# Patient Record
Sex: Male | Born: 1960 | Race: White | Hispanic: No | Marital: Married
Health system: Midwestern US, Academic
[De-identification: ages and names within clinical notes are randomized; demographics above are authoritative.]

---

## 2018-03-10 ENCOUNTER — Encounter: Admit: 2018-03-10 | Discharge: 2018-03-10 | Payer: BC Managed Care – PPO

## 2018-03-10 ENCOUNTER — Emergency Department: Admit: 2018-03-10 | Discharge: 2018-03-10 | Payer: BC Managed Care – PPO

## 2018-03-10 ENCOUNTER — Encounter: Admit: 2018-03-10 | Discharge: 2018-03-11 | Payer: BC Managed Care – PPO

## 2018-03-10 DIAGNOSIS — S32009D Unspecified fracture of unspecified lumbar vertebra, subsequent encounter for fracture with routine healing: ICD-10-CM

## 2018-03-10 DIAGNOSIS — J9690 Respiratory failure, unspecified, unspecified whether with hypoxia or hypercapnia: ICD-10-CM

## 2018-03-10 DIAGNOSIS — W19XXXD Unspecified fall, subsequent encounter: ICD-10-CM

## 2018-03-10 DIAGNOSIS — E43 Unspecified severe protein-calorie malnutrition: ICD-10-CM

## 2018-03-10 DIAGNOSIS — W19XXXA Unspecified fall, initial encounter: Secondary | ICD-10-CM

## 2018-03-10 DIAGNOSIS — E119 Type 2 diabetes mellitus without complications: Principal | ICD-10-CM

## 2018-03-10 DIAGNOSIS — S2220XD Unspecified fracture of sternum, subsequent encounter for fracture with routine healing: ICD-10-CM

## 2018-03-10 DIAGNOSIS — J939 Pneumothorax, unspecified: ICD-10-CM

## 2018-03-10 DIAGNOSIS — S2249XA Multiple fractures of ribs, unspecified side, initial encounter for closed fracture: ICD-10-CM

## 2018-03-10 DIAGNOSIS — T797XXD Traumatic subcutaneous emphysema, subsequent encounter: ICD-10-CM

## 2018-03-10 DIAGNOSIS — S82871A Displaced pilon fracture of right tibia, initial encounter for closed fracture: Principal | ICD-10-CM

## 2018-03-10 MED ORDER — FENTANYL CITRATE (PF) 50 MCG/ML IJ SOLN
100 ug | Freq: Once | INTRAVENOUS | 0 refills | Status: CP
Start: 2018-03-10 — End: ?
  Administered 2018-03-11: 04:00:00 100 ug via INTRAVENOUS

## 2018-03-10 MED ORDER — KETAMINE 10 MG/ML IJ SOLN
30 mg | Freq: Once | INTRAVENOUS | 0 refills | Status: CP
Start: 2018-03-10 — End: ?
  Administered 2018-03-11: 04:00:00 80 mg via INTRAVENOUS

## 2018-03-10 MED ORDER — FENTANYL PCA 550 MCG/55 ML SYR (STD CONC)(ADULT)(PREMADE)
INTRAVENOUS | 0 refills | Status: DC
Start: 2018-03-10 — End: 2018-03-11
  Administered 2018-03-11 (×2): 55.000 mL via INTRAVENOUS

## 2018-03-10 MED ORDER — LACTATED RINGERS IV SOLP
1000 mL | INTRAVENOUS | 0 refills | Status: CP
Start: 2018-03-10 — End: ?
  Administered 2018-03-11: 04:00:00 1000 mL via INTRAVENOUS

## 2018-03-10 MED ORDER — PROPOFOL INJ 10 MG/ML IV VIAL
100 mg | Freq: Once | INTRAVENOUS | 0 refills | Status: CP
Start: 2018-03-10 — End: ?

## 2018-03-10 MED ORDER — FENTANYL CITRATE (PF) 50 MCG/ML IJ SOLN
100 ug | INTRAVENOUS | 0 refills | Status: DC | PRN
Start: 2018-03-10 — End: 2018-03-11

## 2018-03-10 MED ORDER — LACTATED RINGERS IV SOLP
INTRAVENOUS | 0 refills | Status: DC
Start: 2018-03-10 — End: 2018-03-11
  Administered 2018-03-11: 04:00:00 1000.000 mL via INTRAVENOUS

## 2018-03-10 MED ORDER — NALOXONE 0.4 MG/ML IJ SOLN
.08 mg | INTRAVENOUS | 0 refills | Status: DC | PRN
Start: 2018-03-10 — End: 2018-03-18

## 2018-03-11 ENCOUNTER — Encounter: Admit: 2018-03-11 | Discharge: 2018-03-11 | Payer: BC Managed Care – PPO

## 2018-03-11 ENCOUNTER — Inpatient Hospital Stay: Admit: 2018-03-11 | Discharge: 2018-03-11 | Payer: BC Managed Care – PPO

## 2018-03-11 ENCOUNTER — Encounter: Admit: 2018-03-11 | Discharge: 2018-03-12 | Payer: BC Managed Care – PPO

## 2018-03-11 DIAGNOSIS — S32040D Wedge compression fracture of fourth lumbar vertebra, subsequent encounter for fracture with routine healing: ICD-10-CM

## 2018-03-11 DIAGNOSIS — S32020D Wedge compression fracture of second lumbar vertebra, subsequent encounter for fracture with routine healing: ICD-10-CM

## 2018-03-11 DIAGNOSIS — W19XXXD Unspecified fall, subsequent encounter: Principal | ICD-10-CM

## 2018-03-11 DIAGNOSIS — S82871A Displaced pilon fracture of right tibia, initial encounter for closed fracture: Principal | ICD-10-CM

## 2018-03-11 DIAGNOSIS — E119 Type 2 diabetes mellitus without complications: Principal | ICD-10-CM

## 2018-03-11 LAB — BASIC METABOLIC PANEL
Lab: 102 MMOL/L (ref 98–110)
Lab: 13 % — ABNORMAL HIGH (ref 3–12)
Lab: 137 MMOL/L (ref 137–147)
Lab: 139 mg/dL — ABNORMAL HIGH (ref 70–100)
Lab: 22 MMOL/L (ref 21–30)
Lab: 3.9 MMOL/L (ref 3.5–5.1)
Lab: >60 mL/min (ref >60)
Lab: >60 mL/min (ref >60)
Lab: 134 MMOL/L — ABNORMAL LOW (ref 137–147)
Lab: 137 MMOL/L (ref 137–147)

## 2018-03-11 LAB — IONIZED CALCIUM
Lab: 1 MMOL/L — ABNORMAL LOW (ref >60)
Lab: 1 MMOL/L — ABNORMAL LOW (ref 1.0–1.3)

## 2018-03-11 LAB — CBC
Lab: 28 10*3/uL — ABNORMAL HIGH (ref 4.5–11.0)
Lab: 15 K/UL — ABNORMAL HIGH (ref 4.5–11.0)
Lab: 13 % — ABNORMAL LOW (ref 11–15)
Lab: 15 K/UL — ABNORMAL HIGH (ref 4.5–11.0)
Lab: 260 K/UL (ref >60)
Lab: 31 pg (ref 26–34)
Lab: 33 g/dL (ref 32.0–36.0)
Lab: 7 FL (ref >60)

## 2018-03-11 LAB — CREATINE KINASE-CPK
Lab: 314 U/L — ABNORMAL HIGH (ref 35–232)
Lab: 480 U/L — ABNORMAL HIGH (ref 35–232)
Lab: 456 U/L — ABNORMAL HIGH (ref 35–232)

## 2018-03-11 LAB — PROTIME INR (PT): Lab: 1.1 M/UL (ref 0.8–1.2)

## 2018-03-11 LAB — MAGNESIUM
Lab: 1.9 mg/dL — ABNORMAL LOW (ref 1.6–2.6)
Lab: 2 mg/dL — ABNORMAL LOW (ref 1.6–2.6)

## 2018-03-11 LAB — PTT (APTT): Lab: 22 s — ABNORMAL LOW (ref 24.0–36.5)

## 2018-03-11 LAB — PHOSPHORUS
Lab: 3.3 mg/dL — ABNORMAL LOW (ref 2.0–4.5)
Lab: 4.3 mg/dL — ABNORMAL HIGH (ref 2.0–4.5)

## 2018-03-11 LAB — ALCOHOL LEVEL: Lab: <10 mg/dL (ref 8.5–10.6)

## 2018-03-11 LAB — LACTIC ACID (BG - RAPID LACTATE)
Lab: 3.1 MMOL/L — ABNORMAL HIGH (ref 0.5–2.0)
Lab: 2 MMOL/L (ref 0.5–2.0)

## 2018-03-11 LAB — LACTIC ACID(LACTATE): Lab: 1.9 MMOL/L — ABNORMAL LOW (ref 0.5–2.0)

## 2018-03-11 MED ORDER — PROPOFOL INJ 10 MG/ML IV VIAL
0 refills | Status: DC
Start: 2018-03-11 — End: 2018-03-11
  Administered 2018-03-11: 15:00:00 90 mg via INTRAVENOUS

## 2018-03-11 MED ORDER — SENNOSIDES 8.6 MG PO TAB
1 | Freq: Two times a day (BID) | ORAL | 0 refills | Status: DC
Start: 2018-03-11 — End: 2018-03-12
  Administered 2018-03-11 – 2018-03-12 (×2): 1 via ORAL

## 2018-03-11 MED ORDER — PHENYLEPHRINE IN 0.9% NACL(PF) 1 MG/10 ML (100 MCG/ML) IV SYRG
INTRAVENOUS | 0 refills | Status: DC
Start: 2018-03-11 — End: 2018-03-11
  Administered 2018-03-11: 16:00:00 200 ug via INTRAVENOUS

## 2018-03-11 MED ORDER — KETAMINE IV DRIP (LOW DOSE INFUSION)
20 mg/h | INTRAVENOUS | 0 refills | Status: DC
Start: 2018-03-11 — End: 2018-03-13
  Administered 2018-03-11 – 2018-03-13 (×10): 20 mg/h via INTRAVENOUS

## 2018-03-11 MED ORDER — CEFAZOLIN 1 GRAM IJ SOLR
0 refills | Status: DC
Start: 2018-03-11 — End: 2018-03-11
  Administered 2018-03-11: 15:00:00 2 g via INTRAVENOUS

## 2018-03-11 MED ORDER — ENOXAPARIN 40 MG/0.4 ML SC SYRG
40 mg | Freq: Every day | SUBCUTANEOUS | 0 refills | Status: DC
Start: 2018-03-11 — End: 2018-03-18
  Administered 2018-03-12 – 2018-03-18 (×6): 40 mg via SUBCUTANEOUS

## 2018-03-11 MED ORDER — DEXAMETHASONE SODIUM PHOSPHATE 4 MG/ML IJ SOLN
INTRAVENOUS | 0 refills | Status: DC
Start: 2018-03-11 — End: 2018-03-11
  Administered 2018-03-11: 15:00:00 4 mg via INTRAVENOUS

## 2018-03-11 MED ORDER — FENTANYL CITRATE (PF) 50 MCG/ML IJ SOLN
25 ug | INTRAVENOUS | 0 refills | Status: DC | PRN
Start: 2018-03-11 — End: 2018-03-11

## 2018-03-11 MED ORDER — CEFAZOLIN INJ 1GM IVP
2 g | INTRAVENOUS | 0 refills | Status: CP
Start: 2018-03-11 — End: ?
  Administered 2018-03-11 – 2018-03-12 (×3): 2 g via INTRAVENOUS

## 2018-03-11 MED ORDER — MEPERIDINE (PF) 25 MG/ML IJ SYRG
12.5 mg | INTRAVENOUS | 0 refills | Status: DC | PRN
Start: 2018-03-11 — End: 2018-03-11

## 2018-03-11 MED ORDER — SUGAMMADEX 100 MG/ML IV SOLN
INTRAVENOUS | 0 refills | Status: DC
Start: 2018-03-11 — End: 2018-03-11
  Administered 2018-03-11: 16:00:00 200 mg via INTRAVENOUS

## 2018-03-11 MED ORDER — ELECTROLYTE-A IV SOLP
0 refills | Status: DC
Start: 2018-03-11 — End: 2018-03-11
  Administered 2018-03-11 (×2): via INTRAVENOUS

## 2018-03-11 MED ORDER — ENOXAPARIN 30 MG/0.3 ML SC SYRG
30 mg | Freq: Two times a day (BID) | SUBCUTANEOUS | 0 refills | Status: DC
Start: 2018-03-11 — End: 2018-03-11

## 2018-03-11 MED ORDER — OXYCODONE 5 MG PO TAB
5-10 mg | ORAL | 0 refills | Status: DC | PRN
Start: 2018-03-11 — End: 2018-03-29
  Administered 2018-03-11: 19:00:00 5 mg via ORAL
  Administered 2018-03-12 – 2018-03-14 (×11): 10 mg via ORAL
  Administered 2018-03-17 – 2018-03-18 (×2): 5 mg via ORAL
  Administered 2018-03-18: 23:00:00 10 mg via ORAL
  Administered 2018-03-19 (×3): 5 mg via ORAL
  Administered 2018-03-19: 04:00:00 10 mg via ORAL
  Administered 2018-03-20: 16:00:00 5 mg via ORAL
  Administered 2018-03-20 – 2018-03-21 (×3): 10 mg via ORAL
  Administered 2018-03-21: 01:00:00 5 mg via ORAL
  Administered 2018-03-22 – 2018-03-27 (×12): 10 mg via ORAL
  Administered 2018-03-28: 04:00:00 5 mg via ORAL

## 2018-03-11 MED ORDER — ONDANSETRON HCL (PF) 4 MG/2 ML IJ SOLN
INTRAVENOUS | 0 refills | Status: DC
Start: 2018-03-11 — End: 2018-03-11
  Administered 2018-03-11: 16:00:00 4 mg via INTRAVENOUS

## 2018-03-11 MED ORDER — BUPIVACAINE 0.0625% 50ML PCA EPIDURAL SYRINGE
EPIDURAL | 0 refills | Status: DC
Start: 2018-03-11 — End: 2018-03-12
  Administered 2018-03-11 – 2018-03-12 (×8): 50.000 mL via EPIDURAL

## 2018-03-11 MED ORDER — HYDROMORPHONE (PF) 2 MG/ML IJ SYRG
.5-1 mg | INTRAVENOUS | 0 refills | Status: DC | PRN
Start: 2018-03-11 — End: 2018-03-12

## 2018-03-11 MED ORDER — KETAMINE IV DRIP (LOW DOSE INFUSION)
.1 mg/kg/h | INTRAVENOUS | 0 refills | Status: DC
Start: 2018-03-11 — End: 2018-03-11
  Administered 2018-03-11 (×2): 0.1 mg/kg/h via INTRAVENOUS

## 2018-03-11 MED ORDER — POLYETHYLENE GLYCOL 3350 17 GRAM PO PWPK
1 | Freq: Every day | ORAL | 0 refills | Status: DC
Start: 2018-03-11 — End: 2018-03-12
  Administered 2018-03-11: 19:00:00 17 g via ORAL

## 2018-03-11 MED ORDER — DEXTRAN 70-HYPROMELLOSE (PF) 0.1-0.3 % OP DPET
0 refills | Status: DC
Start: 2018-03-11 — End: 2018-03-11
  Administered 2018-03-11: 15:00:00 1 [drp] via OPHTHALMIC

## 2018-03-11 MED ORDER — NALOXONE 0.4 MG/ML IJ SOLN
.08 mg | INTRAVENOUS | 0 refills | Status: DC | PRN
Start: 2018-03-11 — End: 2018-03-11

## 2018-03-11 MED ORDER — ACETAMINOPHEN 325 MG PO TAB
650 mg | ORAL | 0 refills | Status: DC
Start: 2018-03-11 — End: 2018-03-12
  Administered 2018-03-11 – 2018-03-12 (×4): 650 mg via ORAL

## 2018-03-11 MED ORDER — LIDOCAINE (PF) 200 MG/10 ML (2 %) IJ SYRG
0 refills | Status: DC
Start: 2018-03-11 — End: 2018-03-11
  Administered 2018-03-11 (×2): 60 mg via INTRAVENOUS

## 2018-03-11 MED ORDER — LIDOCAINE-EPINEPHRINE (PF) 1.5 %-1:200,000 IJ SOLN (OR)
0 refills | Status: CP
Start: 2018-03-11 — End: ?

## 2018-03-11 MED ORDER — PROMETHAZINE 25 MG/ML IJ SOLN
6.25 mg | INTRAVENOUS | 0 refills | Status: DC | PRN
Start: 2018-03-11 — End: 2018-03-11

## 2018-03-11 MED ORDER — OXYCODONE 5 MG PO TAB
5-10 mg | Freq: Once | ORAL | 0 refills | Status: DC | PRN
Start: 2018-03-11 — End: 2018-03-11

## 2018-03-11 MED ORDER — FENTANYL CITRATE (PF) 50 MCG/ML IJ SOLN
0 refills | Status: DC
Start: 2018-03-11 — End: 2018-03-11
  Administered 2018-03-11 (×3): 50 ug via INTRAVENOUS
  Administered 2018-03-11: 15:00:00 100 ug via INTRAVENOUS

## 2018-03-11 MED ORDER — EPHEDRINE SULFATE 50 MG/5ML SYR (10 MG/ML) (AN)(OSM)
0 refills | Status: DC
Start: 2018-03-11 — End: 2018-03-11
  Administered 2018-03-11 (×2): 10 mg via INTRAVENOUS

## 2018-03-11 MED ORDER — ROCURONIUM 10 MG/ML IV SOLN
INTRAVENOUS | 0 refills | Status: DC
Start: 2018-03-11 — End: 2018-03-11
  Administered 2018-03-11: 15:00:00 50 mg via INTRAVENOUS

## 2018-03-11 MED ORDER — ONDANSETRON HCL (PF) 4 MG/2 ML IJ SOLN
4-8 mg | INTRAVENOUS | 0 refills | Status: DC | PRN
Start: 2018-03-11 — End: 2018-04-04
  Administered 2018-03-12 (×2): 8 mg via INTRAVENOUS
  Administered 2018-03-14 – 2018-04-01 (×8): 4 mg via INTRAVENOUS

## 2018-03-11 MED ORDER — ACETAMINOPHEN 1,000 MG/100 ML (10 MG/ML) IV SOLN
1000 mg | Freq: Once | INTRAVENOUS | 0 refills | Status: CP
Start: 2018-03-11 — End: ?
  Administered 2018-03-11: 07:00:00 1000 mg via INTRAVENOUS

## 2018-03-11 MED ORDER — PHENYLEPHRINE IV DRIP (STD CONC)
0 refills | Status: DC
Start: 2018-03-11 — End: 2018-03-11
  Administered 2018-03-11 (×2): 0.4 ug/kg/min via INTRAVENOUS

## 2018-03-11 MED ORDER — DEXAMETHASONE SODIUM PHOSPHATE 4 MG/ML IJ SOLN
4 mg | Freq: Once | INTRAVENOUS | 0 refills | Status: DC | PRN
Start: 2018-03-11 — End: 2018-03-11

## 2018-03-11 MED ORDER — KETAMINE 10 MG/ML IJ SOLN (INFUSION)(AM)(OR)
0 refills | Status: DC
Start: 2018-03-11 — End: 2018-03-11
  Administered 2018-03-11: 15:00:00 200 ug/kg/h via INTRAVENOUS

## 2018-03-11 MED ORDER — LACTATED RINGERS IV SOLP
1000 mL | INTRAVENOUS | 0 refills | Status: DC
Start: 2018-03-11 — End: 2018-03-11

## 2018-03-11 MED ADMIN — FENTANYL CITRATE (PF) 50 MCG/ML IJ SOLN [3037]: 100 ug | INTRAVENOUS | @ 04:00:00 | Stop: 2018-03-11 | NDC 00409909412

## 2018-03-11 MED ADMIN — PROPOFOL 10 MG/ML IV EMUL [11150]: 50 mg | INTRAVENOUS | @ 04:00:00 | Stop: 2018-03-11 | NDC 63323026965

## 2018-03-11 MED ADMIN — ONDANSETRON HCL (PF) 4 MG/2 ML IJ SOLN [136012]: 4 mg | INTRAVENOUS | Stop: 2018-03-11 | NDC 00641607801

## 2018-03-12 ENCOUNTER — Inpatient Hospital Stay: Admit: 2018-03-12 | Discharge: 2018-03-12 | Payer: BC Managed Care – PPO

## 2018-03-12 LAB — BASIC METABOLIC PANEL
Lab: 0.9 mg/dL (ref 0.4–1.24)
Lab: 120 mg/dL — ABNORMAL HIGH (ref 70–100)
Lab: 13 mg/dL (ref 7–25)
Lab: 134 MMOL/L — ABNORMAL LOW (ref 137–147)
Lab: 8.4 mg/dL — ABNORMAL LOW (ref 8.5–10.6)
Lab: >60 mL/min (ref >60)

## 2018-03-12 LAB — CBC: Lab: 9.9 10*3/uL (ref 4.5–11.0)

## 2018-03-12 LAB — CREATINE KINASE-CPK
Lab: 369 U/L — ABNORMAL HIGH (ref 35–232)
Lab: 349 U/L — ABNORMAL HIGH (ref 35–232)

## 2018-03-12 LAB — MAGNESIUM: Lab: 2.3 mg/dL (ref 1.6–2.6)

## 2018-03-12 LAB — IONIZED CALCIUM: Lab: 1.1 MMOL/L — ABNORMAL LOW (ref 1.0–1.3)

## 2018-03-12 LAB — PHOSPHORUS: Lab: 2.8 mg/dL (ref 2.0–4.5)

## 2018-03-12 MED ORDER — METHOCARBAMOL 750 MG PO TAB
750 mg | Freq: Three times a day (TID) | ORAL | 0 refills | Status: DC
Start: 2018-03-12 — End: 2018-03-19
  Administered 2018-03-12 – 2018-03-19 (×22): 750 mg via ORAL

## 2018-03-12 MED ORDER — GABAPENTIN 300 MG PO CAP
600 mg | ORAL | 0 refills | Status: DC
Start: 2018-03-12 — End: 2018-03-29
  Administered 2018-03-12 – 2018-03-29 (×46): 600 mg via ORAL

## 2018-03-12 MED ORDER — FAMOTIDINE 20 MG PO TAB
20 mg | Freq: Two times a day (BID) | ORAL | 0 refills | Status: DC
Start: 2018-03-12 — End: 2018-03-18
  Administered 2018-03-13 – 2018-03-18 (×11): 20 mg via ORAL

## 2018-03-12 MED ORDER — POTASSIUM PHOSPHATE, MONOBASIC 500 MG PO TBSO
2 | Freq: Once | ORAL | 0 refills | Status: CP
Start: 2018-03-12 — End: ?
  Administered 2018-03-12: 13:00:00 2 via ORAL

## 2018-03-12 MED ORDER — FENTANYL/BUPIVACAINE 2 MCG/ML-0.1% PCA EPIDURAL SYRINGE
EPIDURAL | 0 refills | Status: DC
Start: 2018-03-12 — End: 2018-03-17
  Administered 2018-03-12 – 2018-03-17 (×31): 50.000 mL via EPIDURAL

## 2018-03-12 MED ORDER — POLYETHYLENE GLYCOL 3350 17 GRAM PO PWPK
2 | Freq: Every day | ORAL | 0 refills | Status: DC
Start: 2018-03-12 — End: 2018-03-15
  Administered 2018-03-12 – 2018-03-13 (×2): 34 g via ORAL

## 2018-03-12 MED ORDER — ACETAMINOPHEN 325 MG PO TAB
650 mg | ORAL | 0 refills | Status: DC
Start: 2018-03-12 — End: 2018-03-26
  Administered 2018-03-12 – 2018-03-26 (×44): 650 mg via ORAL

## 2018-03-12 MED ORDER — MAGNESIUM HYDROXIDE 2,400 MG/10 ML PO SUSP
10 mL | Freq: Once | ORAL | 0 refills | Status: CP
Start: 2018-03-12 — End: ?
  Administered 2018-03-12: 20:00:00 10 mL via ORAL

## 2018-03-12 MED ORDER — SENNOSIDES 8.6 MG PO TAB
2 | Freq: Two times a day (BID) | ORAL | 0 refills | Status: DC
Start: 2018-03-12 — End: 2018-03-15
  Administered 2018-03-12 – 2018-03-15 (×5): 2 via ORAL

## 2018-03-12 MED ORDER — SERTRALINE 25 MG PO TAB
12.5 mg | Freq: Every evening | ORAL | 0 refills | Status: DC
Start: 2018-03-12 — End: 2018-04-04
  Administered 2018-03-13 – 2018-04-04 (×22): 12.5 mg via ORAL

## 2018-03-12 MED ORDER — TRAZODONE 50 MG PO TAB
50 mg | Freq: Every evening | ORAL | 0 refills | Status: DC | PRN
Start: 2018-03-12 — End: 2018-03-15

## 2018-03-12 MED ORDER — CALCIUM CARBONATE 200 MG CALCIUM (500 MG) PO CHEW
500 mg | ORAL | 0 refills | Status: DC | PRN
Start: 2018-03-12 — End: 2018-03-26
  Administered 2018-03-14: 04:00:00 500 mg via ORAL

## 2018-03-12 MED ORDER — MELATONIN 5 MG PO TAB
10 mg | Freq: Every evening | ORAL | 0 refills | Status: DC
Start: 2018-03-12 — End: 2018-04-04
  Administered 2018-03-13 – 2018-04-04 (×21): 10 mg via ORAL

## 2018-03-13 ENCOUNTER — Encounter: Admit: 2018-03-13 | Discharge: 2018-03-13 | Payer: BC Managed Care – PPO

## 2018-03-13 DIAGNOSIS — E119 Type 2 diabetes mellitus without complications: Principal | ICD-10-CM

## 2018-03-13 DIAGNOSIS — S82871A Displaced pilon fracture of right tibia, initial encounter for closed fracture: Principal | ICD-10-CM

## 2018-03-13 LAB — CBC: Lab: 10 K/UL — ABNORMAL LOW (ref >60)

## 2018-03-13 LAB — MAGNESIUM: Lab: 2.2 mg/dL (ref 1.6–2.6)

## 2018-03-13 LAB — PHOSPHORUS: Lab: 2.8 mg/dL — ABNORMAL HIGH (ref >60)

## 2018-03-13 LAB — BASIC METABOLIC PANEL: Lab: 135 MMOL/L — ABNORMAL LOW (ref >60)

## 2018-03-14 ENCOUNTER — Inpatient Hospital Stay: Admit: 2018-03-18 | Discharge: 2018-03-18 | Payer: BC Managed Care – PPO

## 2018-03-14 DIAGNOSIS — S82871A Displaced pilon fracture of right tibia, initial encounter for closed fracture: Principal | ICD-10-CM

## 2018-03-14 LAB — CBC: Lab: 12 K/UL — ABNORMAL HIGH (ref >60)

## 2018-03-14 LAB — MAGNESIUM: Lab: 2.1 mg/dL — ABNORMAL LOW (ref 1.6–2.6)

## 2018-03-14 LAB — BASIC METABOLIC PANEL: Lab: 133 MMOL/L — ABNORMAL LOW (ref 137–147)

## 2018-03-14 LAB — PHOSPHORUS: Lab: 3.3 mg/dL — ABNORMAL HIGH (ref 2.0–4.5)

## 2018-03-14 MED ORDER — LACTATED RINGERS IV SOLP
500 mL | INTRAVENOUS | 0 refills | Status: CP
Start: 2018-03-14 — End: ?
  Administered 2018-03-15: 05:00:00 500 mL via INTRAVENOUS

## 2018-03-15 ENCOUNTER — Inpatient Hospital Stay: Admit: 2018-03-14 | Discharge: 2018-03-14 | Payer: BC Managed Care – PPO

## 2018-03-15 DIAGNOSIS — S82871A Displaced pilon fracture of right tibia, initial encounter for closed fracture: Principal | ICD-10-CM

## 2018-03-15 LAB — CBC: Lab: 11 K/UL — ABNORMAL HIGH (ref >60)

## 2018-03-15 LAB — C DIFFICILE BY PCR: Lab: NEGATIVE

## 2018-03-15 LAB — PHOSPHORUS: Lab: 3.7 mg/dL — ABNORMAL LOW (ref 2.0–4.5)

## 2018-03-15 LAB — MAGNESIUM: Lab: 2 mg/dL — ABNORMAL LOW (ref 1.6–2.6)

## 2018-03-15 LAB — BASIC METABOLIC PANEL: Lab: 133 MMOL/L — ABNORMAL LOW (ref >60)

## 2018-03-15 MED ORDER — NALOXEGOL 25 MG PO TAB
25 mg | Freq: Once | ORAL | 0 refills | Status: CP
Start: 2018-03-15 — End: ?
  Administered 2018-03-15: 21:00:00 25 mg via ORAL

## 2018-03-15 MED ORDER — TRAZODONE 50 MG PO TAB
50 mg | Freq: Every evening | ORAL | 0 refills | Status: DC
Start: 2018-03-15 — End: 2018-04-04
  Administered 2018-03-16 – 2018-04-04 (×18): 50 mg via ORAL

## 2018-03-15 MED ORDER — POTASSIUM CHLORIDE 20 MEQ PO TBTQ
20 meq | Freq: Once | ORAL | 0 refills | Status: CP
Start: 2018-03-15 — End: ?
  Administered 2018-03-15: 13:00:00 20 meq via ORAL

## 2018-03-16 ENCOUNTER — Inpatient Hospital Stay: Admit: 2018-03-16 | Discharge: 2018-03-16 | Payer: BC Managed Care – PPO

## 2018-03-16 DIAGNOSIS — S82871A Displaced pilon fracture of right tibia, initial encounter for closed fracture: Principal | ICD-10-CM

## 2018-03-16 MED ORDER — MILK/MOLASSES(#) 1:1 RECTAL ENEMA
1 | Freq: Once | RECTAL | 0 refills | Status: CP
Start: 2018-03-16 — End: ?
  Administered 2018-03-16: 21:00:00 200 mL via RECTAL

## 2018-03-16 MED ORDER — METHYLNALTREXONE 12 MG/0.6 ML SC SOLN
12 mg | Freq: Once | SUBCUTANEOUS | 0 refills | Status: CP
Start: 2018-03-16 — End: ?
  Administered 2018-03-16: 19:00:00 12 mg via SUBCUTANEOUS

## 2018-03-16 MED ORDER — NALOXEGOL 25 MG PO TAB
25 mg | Freq: Once | ORAL | 0 refills | Status: DC
Start: 2018-03-16 — End: 2018-03-16

## 2018-03-17 ENCOUNTER — Inpatient Hospital Stay: Admit: 2018-03-17 | Discharge: 2018-03-17 | Payer: BC Managed Care – PPO

## 2018-03-17 MED ORDER — BISACODYL 10 MG RE SUPP
10 mg | Freq: Once | RECTAL | 0 refills | Status: CP
Start: 2018-03-17 — End: ?
  Administered 2018-03-17: 20:00:00 10 mg via RECTAL

## 2018-03-17 MED ORDER — NALOXONE 0.4 MG/ML IJ SOLN
.08 mg | INTRAVENOUS | 0 refills | Status: DC | PRN
Start: 2018-03-17 — End: 2018-03-18

## 2018-03-17 MED ORDER — SIMETHICONE 80 MG PO CHEW
80 mg | Freq: Once | ORAL | 0 refills | Status: CP
Start: 2018-03-17 — End: ?
  Administered 2018-03-18: 03:00:00 80 mg via ORAL

## 2018-03-17 MED ORDER — SENNOSIDES-DOCUSATE SODIUM 8.6-50 MG PO TAB
1 | Freq: Two times a day (BID) | ORAL | 0 refills | Status: DC
Start: 2018-03-17 — End: 2018-03-22
  Administered 2018-03-17 – 2018-03-22 (×5): 1 via ORAL

## 2018-03-17 MED ORDER — BUPIVACAINE 0.125% PCA EPIDURAL SYR
EPIDURAL | 0 refills | Status: DC
Start: 2018-03-17 — End: 2018-03-18
  Administered 2018-03-17 – 2018-03-18 (×6): 50.000 mL via EPIDURAL

## 2018-03-17 MED ORDER — POLYETHYLENE GLYCOL 3350 17 GRAM PO PWPK
1 | Freq: Every day | ORAL | 0 refills | Status: DC
Start: 2018-03-17 — End: 2018-03-22
  Administered 2018-03-17 – 2018-03-21 (×4): 17 g via ORAL

## 2018-03-18 ENCOUNTER — Encounter: Admit: 2018-03-18 | Discharge: 2018-03-18 | Payer: BC Managed Care – PPO

## 2018-03-18 DIAGNOSIS — S82871A Displaced pilon fracture of right tibia, initial encounter for closed fracture: Principal | ICD-10-CM

## 2018-03-18 LAB — BASIC METABOLIC PANEL
Lab: 0.6 mg/dL — ABNORMAL HIGH (ref 0.4–1.24)
Lab: 118 mg/dL — ABNORMAL HIGH (ref 70–100)
Lab: 12 mg/dL (ref 7–25)
Lab: 138 MMOL/L — ABNORMAL LOW (ref 137–147)
Lab: 30 MMOL/L (ref 21–30)
Lab: 8.1 mg/dL — ABNORMAL LOW (ref 8.5–10.6)
Lab: 9 pg (ref 3–12)
Lab: >60 mL/min (ref >60)
Lab: >60 mL/min (ref >60)

## 2018-03-18 LAB — MAGNESIUM: Lab: 2.2 mg/dL — CL (ref 1.6–2.6)

## 2018-03-18 LAB — PHOSPHORUS: Lab: 3.6 mg/dL — ABNORMAL LOW (ref 2.0–4.5)

## 2018-03-18 LAB — TROPONIN-I: Lab: 0 ng/mL (ref 0.0–0.05)

## 2018-03-18 LAB — CBC: Lab: 11 K/UL — ABNORMAL HIGH (ref 4.5–11.0)

## 2018-03-18 MED ORDER — POTASSIUM CHLORIDE IN WATER 10 MEQ/50 ML IV PGBK
10 meq | INTRAVENOUS | 0 refills | Status: CP
Start: 2018-03-18 — End: ?
  Administered 2018-03-19 (×6): 10 meq via INTRAVENOUS

## 2018-03-18 MED ORDER — PANTOPRAZOLE 40 MG PO TBEC
40 mg | Freq: Every day | ORAL | 0 refills | Status: DC
Start: 2018-03-18 — End: 2018-03-26
  Administered 2018-03-19 – 2018-03-26 (×8): 40 mg via ORAL

## 2018-03-18 MED ORDER — DIPHENHYDRAMINE HCL 50 MG/ML IJ SOLN
25 mg | Freq: Once | INTRAVENOUS | 0 refills | Status: CP
Start: 2018-03-18 — End: ?
  Administered 2018-03-18: 20:00:00 25 mg via INTRAVENOUS

## 2018-03-18 MED ORDER — POTASSIUM CHLORIDE IN WATER 10 MEQ/50 ML IV PGBK
10 meq | INTRAVENOUS | 0 refills | Status: CP
Start: 2018-03-18 — End: ?
  Administered 2018-03-18 – 2018-03-19 (×6): 10 meq via INTRAVENOUS

## 2018-03-18 MED ORDER — POTASSIUM CHLORIDE 20 MEQ PO TBTQ
40 meq | Freq: Once | ORAL | 0 refills | Status: CP
Start: 2018-03-18 — End: ?
  Administered 2018-03-19: 05:00:00 40 meq via ORAL

## 2018-03-18 MED ORDER — PANTOPRAZOLE(#) 2MG/ML PO SUSP
40 mg | Freq: Every day | ORAL | 0 refills | Status: DC
Start: 2018-03-18 — End: 2018-03-18

## 2018-03-18 MED ORDER — ENOXAPARIN 30 MG/0.3 ML SC SYRG
30 mg | Freq: Two times a day (BID) | SUBCUTANEOUS | 0 refills | Status: DC
Start: 2018-03-18 — End: 2018-03-29
  Administered 2018-03-19 – 2018-03-29 (×20): 30 mg via SUBCUTANEOUS

## 2018-03-18 MED ORDER — IOHEXOL 350 MG IODINE/ML IV SOLN
100 mL | Freq: Once | INTRAVENOUS | 0 refills | Status: CP
Start: 2018-03-18 — End: ?
  Administered 2018-03-18: 20:00:00 100 mL via INTRAVENOUS

## 2018-03-18 MED ORDER — SODIUM CHLORIDE 0.9 % IJ SOLN
50 mL | Freq: Once | INTRAVENOUS | 0 refills | Status: CP
Start: 2018-03-18 — End: ?
  Administered 2018-03-18: 20:00:00 50 mL via INTRAVENOUS

## 2018-03-18 MED ORDER — DEXTRAN 70-HYPROMELLOSE 0.1-0.3 % OP DROP
1 [drp] | OPHTHALMIC | 0 refills | Status: DC | PRN
Start: 2018-03-18 — End: 2018-04-04

## 2018-03-18 MED ADMIN — SODIUM CHLORIDE 0.9 % IV SOLP [27838]: 1000 mL | INTRAVENOUS | @ 18:00:00 | Stop: 2018-03-18 | NDC 00338004904

## 2018-03-19 ENCOUNTER — Inpatient Hospital Stay: Admit: 2018-03-19 | Discharge: 2018-03-19 | Payer: BC Managed Care – PPO

## 2018-03-19 ENCOUNTER — Encounter: Admit: 2018-03-19 | Discharge: 2018-03-19 | Payer: BC Managed Care – PPO

## 2018-03-19 DIAGNOSIS — S82871A Displaced pilon fracture of right tibia, initial encounter for closed fracture: Principal | ICD-10-CM

## 2018-03-19 LAB — BASIC METABOLIC PANEL
Lab: 0.5 mg/dL (ref 0.4–1.24)
Lab: 10 % — ABNORMAL HIGH (ref <=5)
Lab: 102 MMOL/L (ref 98–110)
Lab: 11 mg/dL — AB (ref <=5)
Lab: 110 mg/dL — ABNORMAL HIGH (ref <=2)
Lab: 141 MMOL/L — ABNORMAL LOW (ref 137–147)
Lab: 2.7 MMOL/L — CL (ref 3.5–5.1)
Lab: 29 MMOL/L (ref 21–30)
Lab: 8.3 mg/dL — ABNORMAL LOW (ref 8.5–10.6)
Lab: >60 mL/min — AB (ref >60)
Lab: 0.5 mg/dL (ref 0.4–1.24)
Lab: 10 mg/dL (ref 7–25)
Lab: 103 MMOL/L (ref 98–110)
Lab: 122 mg/dL — ABNORMAL HIGH (ref 70–100)
Lab: 140 MMOL/L (ref 137–147)
Lab: 26 MMOL/L (ref 21–30)
Lab: 3 MMOL/L — ABNORMAL LOW (ref 3.5–5.1)
Lab: 8.1 mg/dL — ABNORMAL LOW (ref 8.5–10.6)
Lab: >60 mL/min (ref >60)
Lab: >60 mL/min (ref >60)

## 2018-03-19 MED ORDER — CYCLOBENZAPRINE 10 MG PO TAB
10 mg | Freq: Three times a day (TID) | ORAL | 0 refills | Status: DC
Start: 2018-03-19 — End: 2018-03-26
  Administered 2018-03-19 – 2018-03-26 (×21): 10 mg via ORAL

## 2018-03-19 MED ORDER — FENTANYL CITRATE (PF) 50 MCG/ML IJ SOLN
100 ug | Freq: Once | INTRAVENOUS | 0 refills | Status: CP
Start: 2018-03-19 — End: ?

## 2018-03-19 MED ORDER — POTASSIUM CHLORIDE IN WATER 10 MEQ/50 ML IV PGBK
10 meq | INTRAVENOUS | 0 refills | Status: CP
Start: 2018-03-19 — End: ?
  Administered 2018-03-19 (×4): 10 meq via INTRAVENOUS

## 2018-03-19 MED ORDER — POTASSIUM CHLORIDE 20 MEQ PO TBTQ
40 meq | ORAL | 0 refills | Status: CP
Start: 2018-03-19 — End: ?
  Administered 2018-03-20 (×2): 40 meq via ORAL

## 2018-03-19 MED ORDER — MAGNESIUM SULFATE IN WATER 4 GRAM/50 ML (8 %) IV PGBK
4 g | Freq: Once | INTRAVENOUS | 0 refills | Status: CP
Start: 2018-03-19 — End: ?
  Administered 2018-03-19: 16:00:00 4 g via INTRAVENOUS

## 2018-03-19 MED ORDER — POTASSIUM CHLORIDE 20 MEQ PO TBTQ
80 meq | Freq: Once | ORAL | 0 refills | Status: CP
Start: 2018-03-19 — End: ?
  Administered 2018-03-19: 16:00:00 80 meq via ORAL

## 2018-03-19 MED ADMIN — SODIUM CHLORIDE 0.9 % IV SOLP [27838]: 1000 mL | INTRAVENOUS | @ 16:00:00 | Stop: 2018-03-19 | NDC 00338004904

## 2018-03-19 MED ADMIN — FENTANYL CITRATE (PF) 50 MCG/ML IJ SOLN [3037]: 100 ug | INTRAVENOUS | @ 22:00:00 | Stop: 2018-03-19 | NDC 00409909412

## 2018-03-20 ENCOUNTER — Emergency Department: Admit: 2018-03-10 | Discharge: 2018-03-10 | Payer: BC Managed Care – PPO

## 2018-03-20 ENCOUNTER — Inpatient Hospital Stay: Admit: 2018-03-20 | Discharge: 2018-03-20 | Payer: BC Managed Care – PPO

## 2018-03-20 ENCOUNTER — Inpatient Hospital Stay: Admit: 2018-03-20 | Discharge: 2018-03-21 | Payer: BC Managed Care – PPO

## 2018-03-20 DIAGNOSIS — S82871A Displaced pilon fracture of right tibia, initial encounter for closed fracture: Principal | ICD-10-CM

## 2018-03-20 LAB — BASIC METABOLIC PANEL
Lab: 0.5 mg/dL — ABNORMAL LOW (ref 0.4–1.24)
Lab: 102 MMOL/L (ref 98–110)
Lab: 131 mg/dL — ABNORMAL HIGH (ref 70–100)
Lab: 138 MMOL/L (ref 137–147)
Lab: 27 MMOL/L (ref 21–30)
Lab: 3.2 MMOL/L — ABNORMAL LOW (ref 3.5–5.1)
Lab: 8.1 mg/dL — ABNORMAL LOW (ref 8.5–10.6)
Lab: 9 K/UL — ABNORMAL HIGH (ref 3–12)
Lab: 9 mg/dL (ref 7–25)
Lab: >60 mL/min (ref >60)
Lab: 0.5 mg/dL (ref 0.4–1.24)
Lab: 104 MMOL/L (ref 98–110)
Lab: 138 MMOL/L — ABNORMAL LOW (ref 137–147)
Lab: 28 MMOL/L — ABNORMAL HIGH (ref 21–30)
Lab: 6 (ref 3–12)
Lab: 8 mg/dL (ref 7–25)
Lab: 98 mg/dL (ref 70–100)

## 2018-03-20 MED ORDER — SIMETHICONE 80 MG PO CHEW
80 mg | Freq: Two times a day (BID) | ORAL | 0 refills | Status: DC
Start: 2018-03-20 — End: 2018-03-26
  Administered 2018-03-20 – 2018-03-26 (×13): 80 mg via ORAL

## 2018-03-20 MED ORDER — POTASSIUM CHLORIDE 20 MEQ PO TBTQ
60 meq | Freq: Once | ORAL | 0 refills | Status: CP
Start: 2018-03-20 — End: ?
  Administered 2018-03-20: 17:00:00 60 meq via ORAL

## 2018-03-20 MED ORDER — NALOXEGOL 25 MG PO TAB
25 mg | Freq: Every day | ORAL | 0 refills | Status: CP
Start: 2018-03-20 — End: ?
  Administered 2018-03-20 – 2018-03-22 (×3): 25 mg via ORAL

## 2018-03-21 DIAGNOSIS — S82871A Displaced pilon fracture of right tibia, initial encounter for closed fracture: Principal | ICD-10-CM

## 2018-03-21 LAB — MAGNESIUM: Lab: 2.1 mg/dL — ABNORMAL LOW (ref 1.6–2.6)

## 2018-03-21 LAB — CBC: Lab: 10 10*3/uL (ref 4.5–11.0)

## 2018-03-21 LAB — BASIC METABOLIC PANEL
Lab: 0.6 mg/dL — ABNORMAL HIGH (ref 0.4–1.24)
Lab: 12 pg (ref 3–12)
Lab: 137 MMOL/L — ABNORMAL LOW (ref 137–147)
Lab: 146 mg/dL — ABNORMAL HIGH (ref 70–100)
Lab: 26 MMOL/L (ref 21–30)

## 2018-03-21 LAB — PHOSPHORUS: Lab: 3.6 mg/dL — ABNORMAL LOW (ref 2.0–4.5)

## 2018-03-21 MED ORDER — POTASSIUM CHLORIDE 20 MEQ PO TBTQ
40 meq | Freq: Once | ORAL | 0 refills | Status: CP
Start: 2018-03-21 — End: ?
  Administered 2018-03-21: 21:00:00 40 meq via ORAL

## 2018-03-21 MED ORDER — LR WITH POTASSIUM CHLORIDE 40 MEQ/L IV INFUSION
INTRAVENOUS | 0 refills | Status: AC
Start: 2018-03-21 — End: ?
  Administered 2018-03-21 – 2018-03-22 (×4): 1000.000 mL via INTRAVENOUS

## 2018-03-22 ENCOUNTER — Inpatient Hospital Stay: Admit: 2018-03-22 | Discharge: 2018-03-22 | Payer: BC Managed Care – PPO

## 2018-03-22 LAB — BASIC METABOLIC PANEL
Lab: 0.5 mg/dL (ref 0.4–1.24)
Lab: 10 mg/dL (ref 7–25)
Lab: 105 mg/dL — ABNORMAL HIGH (ref 70–100)
Lab: 135 MMOL/L — ABNORMAL LOW (ref 137–147)
Lab: 22 MMOL/L (ref 21–30)
Lab: 4 MMOL/L (ref 3.5–5.1)
Lab: 8.4 mg/dL — ABNORMAL LOW (ref 8.5–10.6)
Lab: >60 mL/min (ref >60)
Lab: >60 mL/min (ref >60)
Lab: 135 MMOL/L — ABNORMAL LOW (ref 137–147)

## 2018-03-22 LAB — PHOSPHORUS: Lab: 3.9 mg/dL — ABNORMAL HIGH (ref 2.0–4.5)

## 2018-03-22 LAB — MAGNESIUM: Lab: 2 mg/dL — ABNORMAL HIGH (ref 1.6–2.6)

## 2018-03-22 LAB — CBC: Lab: 11 10*3/uL — ABNORMAL HIGH (ref 4.5–11.0)

## 2018-03-22 LAB — TROPONIN-I: Lab: 0 ng/mL (ref 0.0–0.05)

## 2018-03-22 MED ORDER — MILK/MOLASSES(#) 1:1 RECTAL ENEMA
1 | RECTAL | 0 refills | Status: DC
Start: 2018-03-22 — End: 2018-03-25
  Administered 2018-03-22 – 2018-03-25 (×9): 200 mL via RECTAL

## 2018-03-22 MED ORDER — POTASSIUM CHLORIDE 20 MEQ PO TBTQ
40 meq | Freq: Once | ORAL | 0 refills | Status: CP
Start: 2018-03-22 — End: ?
  Administered 2018-03-22: 15:00:00 40 meq via ORAL

## 2018-03-22 MED ORDER — POLYETHYLENE GLYCOL 3350 17 GRAM PO PWPK
2 | Freq: Every day | ORAL | 0 refills | Status: DC
Start: 2018-03-22 — End: 2018-03-31
  Administered 2018-03-23 – 2018-03-27 (×5): 34 g via ORAL

## 2018-03-22 MED ORDER — SENNOSIDES-DOCUSATE SODIUM 8.6-50 MG PO TAB
2 | Freq: Two times a day (BID) | ORAL | 0 refills | Status: DC
Start: 2018-03-22 — End: 2018-03-29
  Administered 2018-03-23 – 2018-03-29 (×12): 2 via ORAL

## 2018-03-22 MED ORDER — MAGNESIUM HYDROXIDE 2,400 MG/10 ML PO SUSP
20 mL | Freq: Every evening | ORAL | 0 refills | Status: DC
Start: 2018-03-22 — End: 2018-03-31
  Administered 2018-03-23 – 2018-03-29 (×7): 20 mL via ORAL

## 2018-03-23 LAB — BASIC METABOLIC PANEL: Lab: 136 MMOL/L — ABNORMAL LOW (ref >60)

## 2018-03-23 LAB — CBC: Lab: 8.4 K/UL — ABNORMAL HIGH (ref 4.5–11.0)

## 2018-03-23 LAB — PHOSPHORUS: Lab: 4.4 mg/dL — ABNORMAL LOW (ref 2.0–4.5)

## 2018-03-23 LAB — MAGNESIUM: Lab: 2.2 mg/dL — ABNORMAL LOW (ref >60)

## 2018-03-23 MED ORDER — POTASSIUM CHLORIDE 20 MEQ PO TBTQ
40 meq | Freq: Once | ORAL | 0 refills | Status: CP
Start: 2018-03-23 — End: ?
  Administered 2018-03-23: 20:00:00 40 meq via ORAL

## 2018-03-24 ENCOUNTER — Inpatient Hospital Stay: Admit: 2018-03-24 | Discharge: 2018-03-24 | Payer: BC Managed Care – PPO

## 2018-03-24 DIAGNOSIS — S82871A Displaced pilon fracture of right tibia, initial encounter for closed fracture: Principal | ICD-10-CM

## 2018-03-25 LAB — BASIC METABOLIC PANEL
Lab: 0.7 mg/dL (ref 0.4–1.24)
Lab: 10 (ref 3–12)
Lab: 104 mg/dL — ABNORMAL HIGH (ref 70–100)
Lab: 135 MMOL/L — ABNORMAL LOW (ref 137–147)
Lab: 2.8 MMOL/L — ABNORMAL LOW (ref 3.5–5.1)
Lab: 26 MMOL/L (ref 21–30)
Lab: 8 mg/dL (ref 7–25)
Lab: 8.4 mg/dL — ABNORMAL LOW (ref 8.5–10.6)
Lab: 99 MMOL/L (ref 98–110)
Lab: >60 mL/min (ref >60)
Lab: >60 mL/min (ref >60)

## 2018-03-25 MED ORDER — POTASSIUM CHLORIDE 20 MEQ PO TBTQ
20 meq | Freq: Once | ORAL | 0 refills | Status: CP
Start: 2018-03-25 — End: ?
  Administered 2018-03-25: 20:00:00 20 meq via ORAL

## 2018-03-25 MED ORDER — POTASSIUM CHLORIDE IN WATER 10 MEQ/50 ML IV PGBK
10 meq | INTRAVENOUS | 0 refills | Status: CP
Start: 2018-03-25 — End: ?
  Administered 2018-03-25 – 2018-03-26 (×5): 10 meq via INTRAVENOUS

## 2018-03-25 MED ORDER — BISACODYL 10 MG RE SUPP
10 mg | Freq: Two times a day (BID) | RECTAL | 0 refills | Status: DC
Start: 2018-03-25 — End: 2018-04-04
  Administered 2018-03-25 – 2018-04-03 (×8): 10 mg via RECTAL

## 2018-03-25 MED ORDER — POTASSIUM CHLORIDE 20 MEQ PO TBTQ
40 meq | Freq: Once | ORAL | 0 refills | Status: CP
Start: 2018-03-25 — End: ?
  Administered 2018-03-26: 04:00:00 40 meq via ORAL

## 2018-03-25 MED ORDER — MILK/MOLASSES(#) 1:1 RECTAL ENEMA
1 | Freq: Two times a day (BID) | RECTAL | 0 refills | Status: DC
Start: 2018-03-25 — End: 2018-03-26
  Administered 2018-03-25: 22:00:00 200 mL via RECTAL

## 2018-03-25 MED ORDER — POTASSIUM CHLORIDE IN WATER 10 MEQ/50 ML IV PGBK
10 meq | INTRAVENOUS | 0 refills | Status: CP
Start: 2018-03-25 — End: ?
  Administered 2018-03-26 (×4): 10 meq via INTRAVENOUS

## 2018-03-25 MED ORDER — PATCH DOCUMENTATION - LIDOCAINE 5%
Freq: Two times a day (BID) | TRANSDERMAL | 0 refills | Status: DC
Start: 2018-03-25 — End: 2018-03-29

## 2018-03-25 MED ORDER — LIDOCAINE 5 % TP PTMD
1 | Freq: Every day | TOPICAL | 0 refills | Status: DC
Start: 2018-03-25 — End: 2018-03-29
  Administered 2018-03-25 – 2018-03-29 (×5): 1 via TOPICAL

## 2018-03-25 MED ADMIN — SODIUM CHLORIDE 0.9 % IV SOLP [27838]: 1000 mL | INTRAVENOUS | @ 20:00:00 | Stop: 2018-03-25 | NDC 00338004904

## 2018-03-26 ENCOUNTER — Inpatient Hospital Stay: Admit: 2018-03-26 | Discharge: 2018-03-26 | Payer: BC Managed Care – PPO

## 2018-03-26 DIAGNOSIS — S82871A Displaced pilon fracture of right tibia, initial encounter for closed fracture: Principal | ICD-10-CM

## 2018-03-26 LAB — MAGNESIUM
Lab: 2.4 mg/dL — ABNORMAL LOW (ref 1.6–2.6)
Lab: 2.4 mg/dL — ABNORMAL LOW (ref 1.6–2.6)

## 2018-03-26 LAB — BASIC METABOLIC PANEL
Lab: 0.6 mg/dL — AB (ref 0.4–1.24)
Lab: 10 — AB (ref 3–12)
Lab: 101 MMOL/L (ref 98–110)
Lab: 118 mg/dL — ABNORMAL HIGH (ref 70–100)
Lab: 135 MMOL/L — ABNORMAL LOW (ref 137–147)
Lab: 24 MMOL/L (ref 21–30)
Lab: 7 mg/dL — AB (ref 7–25)
Lab: 8.4 mg/dL — ABNORMAL LOW (ref 8.5–10.6)
Lab: >60 mL/min (ref >60)
Lab: >60 mL/min (ref >60)
Lab: 139 MMOL/L — ABNORMAL LOW (ref 137–147)

## 2018-03-26 LAB — POTASSIUM: Lab: 3.5 MMOL/L — ABNORMAL LOW (ref 3.5–5.1)

## 2018-03-26 LAB — PHOSPHORUS: Lab: 3.6 mg/dL (ref 2.0–4.5)

## 2018-03-26 MED ORDER — POTASSIUM CHLORIDE 20 MEQ PO TBTQ
40 meq | Freq: Once | ORAL | 0 refills | Status: CP
Start: 2018-03-26 — End: ?
  Administered 2018-03-26: 13:00:00 40 meq via ORAL

## 2018-03-26 MED ORDER — MILK/MOLASSES(#) 1:1 RECTAL ENEMA
1 | Freq: Two times a day (BID) | RECTAL | 0 refills | Status: DC | PRN
Start: 2018-03-26 — End: 2018-03-31

## 2018-03-26 MED ORDER — ATORVASTATIN 20 MG PO TAB
20 mg | Freq: Every evening | ORAL | 0 refills | Status: DC
Start: 2018-03-26 — End: 2018-03-26

## 2018-03-26 MED ORDER — POTASSIUM CHLORIDE IN WATER 10 MEQ/50 ML IV PGBK
10 meq | INTRAVENOUS | 0 refills | Status: CP
Start: 2018-03-26 — End: ?
  Administered 2018-03-26 (×3): 10 meq via INTRAVENOUS

## 2018-03-26 MED ORDER — PANCRELIPASE-SODIUM BICARBONATE 20,880 K UNIT-650 MG
NASOGASTRIC | 0 refills | Status: DC | PRN
Start: 2018-03-26 — End: 2018-03-27
  Administered 2018-03-27 (×4): via NASOGASTRIC

## 2018-03-26 MED ORDER — PHENOL 1.4 % MM SPRA
2 | OROMUCOSAL | 0 refills | Status: DC | PRN
Start: 2018-03-26 — End: 2018-04-04
  Administered 2018-03-26: 23:00:00 2 via OROMUCOSAL

## 2018-03-26 MED ORDER — POTASSIUM CHLORIDE IN WATER 10 MEQ/50 ML IV PGBK
10 meq | INTRAVENOUS | 0 refills | Status: CP
Start: 2018-03-26 — End: ?
  Administered 2018-03-26 (×4): 10 meq via INTRAVENOUS

## 2018-03-26 MED ORDER — LISINOPRIL 10 MG PO TAB
10 mg | Freq: Every morning | ORAL | 0 refills | Status: DC
Start: 2018-03-26 — End: 2018-04-04
  Administered 2018-03-26 – 2018-04-04 (×9): 10 mg via ORAL

## 2018-03-26 MED ORDER — POTASSIUM CHLORIDE 20 MEQ PO TBTQ
40 meq | Freq: Once | ORAL | 0 refills | Status: CP
Start: 2018-03-26 — End: ?
  Administered 2018-03-27: 03:00:00 40 meq via ORAL

## 2018-03-26 MED ORDER — ACETAMINOPHEN 325 MG PO TAB
650 mg | ORAL | 0 refills | Status: DC | PRN
Start: 2018-03-26 — End: 2018-03-28
  Administered 2018-03-27: 23:00:00 650 mg via ORAL

## 2018-03-26 MED ADMIN — SODIUM CHLORIDE 0.9 % IV SOLP [27838]: 1000 mL | INTRAVENOUS | @ 08:00:00 | Stop: 2018-03-26 | NDC 00338004904

## 2018-03-27 DIAGNOSIS — S82871A Displaced pilon fracture of right tibia, initial encounter for closed fracture: Principal | ICD-10-CM

## 2018-03-27 LAB — MAGNESIUM: Lab: 2.4 mg/dL — ABNORMAL LOW (ref >60)

## 2018-03-27 LAB — POTASSIUM
Lab: 3.6 MMOL/L (ref 3.5–5.1)
Lab: 3.4 MMOL/L — ABNORMAL LOW (ref 3.5–5.1)

## 2018-03-27 LAB — BASIC METABOLIC PANEL: Lab: 142 MMOL/L — ABNORMAL LOW (ref 137–147)

## 2018-03-27 MED ORDER — POTASSIUM CHLORIDE IN WATER 10 MEQ/50 ML IV PGBK
10 meq | INTRAVENOUS | 0 refills | Status: CP
Start: 2018-03-27 — End: ?
  Administered 2018-03-27 (×4): 10 meq via INTRAVENOUS

## 2018-03-27 MED ORDER — FENTANYL CITRATE (PF) 50 MCG/ML IJ SOLN
50 ug | Freq: Once | INTRAVENOUS | 0 refills | Status: CP
Start: 2018-03-27 — End: ?
  Administered 2018-03-27: 10:00:00 50 ug via INTRAVENOUS

## 2018-03-27 MED ORDER — POTASSIUM CHLORIDE 20 MEQ PO TBTQ
40 meq | Freq: Once | ORAL | 0 refills | Status: CP
Start: 2018-03-27 — End: ?
  Administered 2018-03-27: 15:00:00 40 meq via ORAL

## 2018-03-27 MED ORDER — POTASSIUM CHLORIDE IN WATER 10 MEQ/50 ML IV PGBK
10 meq | INTRAVENOUS | 0 refills | Status: CP
Start: 2018-03-27 — End: ?
  Administered 2018-03-28 (×4): 10 meq via INTRAVENOUS

## 2018-03-28 ENCOUNTER — Inpatient Hospital Stay: Admit: 2018-03-28 | Discharge: 2018-03-28 | Payer: BC Managed Care – PPO

## 2018-03-28 ENCOUNTER — Encounter: Admit: 2018-03-28 | Discharge: 2018-03-28 | Payer: BC Managed Care – PPO

## 2018-03-28 ENCOUNTER — Encounter: Admit: 2018-03-28 | Discharge: 2018-03-29 | Payer: BC Managed Care – PPO

## 2018-03-28 DIAGNOSIS — S82871A Displaced pilon fracture of right tibia, initial encounter for closed fracture: Principal | ICD-10-CM

## 2018-03-28 LAB — CBC
Lab: 13 % (ref 11–15)
Lab: 29 pg (ref 26–34)
Lab: 32 g/dL (ref 32.0–36.0)
Lab: 33 % — ABNORMAL LOW (ref 40–50)
Lab: 5.9 10*3/uL (ref 4.5–11.0)
Lab: 90 FL (ref 80–100)

## 2018-03-28 LAB — BASIC METABOLIC PANEL: Lab: 143 MMOL/L — ABNORMAL LOW (ref >60)

## 2018-03-28 LAB — MAGNESIUM
Lab: 2.3 mg/dL — ABNORMAL LOW (ref 1.6–2.6)
Lab: 2.2 mg/dL — ABNORMAL LOW (ref >60)

## 2018-03-28 LAB — PHOSPHORUS: Lab: 4 mg/dL — ABNORMAL LOW (ref 2.0–4.5)

## 2018-03-28 MED ORDER — POTASSIUM CHLORIDE 20 MEQ PO TBTQ
40 meq | Freq: Once | ORAL | 0 refills | Status: AC
Start: 2018-03-28 — End: ?

## 2018-03-28 MED ORDER — GABAPENTIN 300 MG PO CAP
600 mg | ORAL | 0 refills | Status: DC
Start: 2018-03-28 — End: 2018-03-28

## 2018-03-28 MED ORDER — ROCURONIUM 10 MG/ML IV SOLN
INTRAVENOUS | 0 refills | Status: DC
Start: 2018-03-28 — End: 2018-03-28
  Administered 2018-03-28: 21:00:00 20 mg via INTRAVENOUS
  Administered 2018-03-28 (×2): 10 mg via INTRAVENOUS
  Administered 2018-03-28: 20:00:00 30 mg via INTRAVENOUS

## 2018-03-28 MED ORDER — PHENYLEPHRINE IN 0.9% NACL(PF) 1 MG/10 ML (100 MCG/ML) IV SYRG
INTRAVENOUS | 0 refills | Status: DC
Start: 2018-03-28 — End: 2018-03-28
  Administered 2018-03-28 (×2): 200 ug via INTRAVENOUS
  Administered 2018-03-28: 21:00:00 100 ug via INTRAVENOUS

## 2018-03-28 MED ORDER — SUGAMMADEX 100 MG/ML IV SOLN
INTRAVENOUS | 0 refills | Status: DC
Start: 2018-03-28 — End: 2018-03-28
  Administered 2018-03-28: 23:00:00 220 mg via INTRAVENOUS

## 2018-03-28 MED ORDER — EPHEDRINE SULFATE 50 MG/5ML SYR (10 MG/ML) (AN)(OSM)
0 refills | Status: DC
Start: 2018-03-28 — End: 2018-03-28
  Administered 2018-03-28 (×2): 10 mg via INTRAVENOUS

## 2018-03-28 MED ORDER — ROPIVACAINE (PF) 5 MG/ML (0.5 %) IJ SOLN
0 refills | Status: CP
Start: 2018-03-28 — End: ?
  Administered 2018-03-28: 20:00:00 10 mL

## 2018-03-28 MED ORDER — LIDOCAINE (PF) 10 MG/ML (1 %) IJ SOLN
SUBCUTANEOUS | 0 refills | Status: CP
Start: 2018-03-28 — End: ?
  Administered 2018-03-28: 20:00:00 3 mL via SUBCUTANEOUS

## 2018-03-28 MED ORDER — ACETAMINOPHEN 500 MG PO TAB
1000 mg | ORAL | 0 refills | Status: DC
Start: 2018-03-28 — End: 2018-03-29
  Administered 2018-03-29 (×2): 1000 mg via ORAL

## 2018-03-28 MED ORDER — HYDROMORPHONE (PF) 2 MG/ML IJ SYRG
.5-1 mg | INTRAVENOUS | 0 refills | Status: DC | PRN
Start: 2018-03-28 — End: 2018-03-29
  Administered 2018-03-29: 01:00:00 0.5 mg via INTRAVENOUS

## 2018-03-28 MED ORDER — DEXTRAN 70-HYPROMELLOSE (PF) 0.1-0.3 % OP DPET
0 refills | Status: DC
Start: 2018-03-28 — End: 2018-03-28
  Administered 2018-03-28: 20:00:00 2 [drp] via OPHTHALMIC

## 2018-03-28 MED ORDER — FENTANYL CITRATE (PF) 50 MCG/ML IJ SOLN
25 ug | INTRAVENOUS | 0 refills | Status: DC | PRN
Start: 2018-03-28 — End: 2018-03-29
  Administered 2018-03-29 (×3): 25 ug via INTRAVENOUS

## 2018-03-28 MED ORDER — FENTANYL CITRATE (PF) 50 MCG/ML IJ SOLN
0 refills | Status: DC
Start: 2018-03-28 — End: 2018-03-28
  Administered 2018-03-28: 23:00:00 50 ug via INTRAVENOUS

## 2018-03-28 MED ORDER — ONDANSETRON HCL (PF) 4 MG/2 ML IJ SOLN
INTRAVENOUS | 0 refills | Status: DC
Start: 2018-03-28 — End: 2018-03-28
  Administered 2018-03-28: 23:00:00 4 mg via INTRAVENOUS

## 2018-03-28 MED ORDER — ACETAMINOPHEN 1,000 MG/100 ML (10 MG/ML) IV SOLN
0 refills | Status: DC
Start: 2018-03-28 — End: 2018-03-28
  Administered 2018-03-28: 23:00:00 1000 mg via INTRAVENOUS

## 2018-03-28 MED ORDER — DEXAMETHASONE SODIUM PHOSPHATE 4 MG/ML IJ SOLN
4 mg | Freq: Once | INTRAVENOUS | 0 refills | Status: DC | PRN
Start: 2018-03-28 — End: 2018-03-29

## 2018-03-28 MED ORDER — CEFAZOLIN INJ 1GM IVP
2 g | INTRAVENOUS | 0 refills | Status: CP
Start: 2018-03-28 — End: ?
  Administered 2018-03-29 (×2): 2 g via INTRAVENOUS

## 2018-03-28 MED ORDER — MIDAZOLAM 1 MG/ML IJ SOLN
0 refills | Status: DC
Start: 2018-03-28 — End: 2018-03-28
  Administered 2018-03-28: 20:00:00 2 mg via INTRAVENOUS

## 2018-03-28 MED ORDER — DEXAMETHASONE SODIUM PHOSPHATE 4 MG/ML IJ SOLN
INTRAVENOUS | 0 refills | Status: DC
Start: 2018-03-28 — End: 2018-03-28
  Administered 2018-03-28: 21:00:00 4 mg via INTRAVENOUS

## 2018-03-28 MED ORDER — FENTANYL CITRATE (PF) 50 MCG/ML IJ SOLN
25 ug | INTRAVENOUS | 0 refills | Status: DC | PRN
Start: 2018-03-28 — End: 2018-03-29

## 2018-03-28 MED ORDER — LIDOCAINE (PF) 200 MG/10 ML (2 %) IJ SYRG
0 refills | Status: DC
Start: 2018-03-28 — End: 2018-03-28
  Administered 2018-03-28: 20:00:00 100 mg via INTRAVENOUS

## 2018-03-28 MED ORDER — POTASSIUM CHLORIDE IN WATER 10 MEQ/50 ML IV PGBK
0 refills | Status: DC
Start: 2018-03-28 — End: 2018-03-28
  Administered 2018-03-28 (×3): 10 meq via INTRAVENOUS

## 2018-03-28 MED ORDER — OXYCODONE 5 MG PO TAB
5-10 mg | ORAL | 0 refills | Status: DC | PRN
Start: 2018-03-28 — End: 2018-03-28

## 2018-03-28 MED ORDER — PROPOFOL INJ 10 MG/ML IV VIAL
0 refills | Status: DC
Start: 2018-03-28 — End: 2018-03-28
  Administered 2018-03-28: 20:00:00 150 mg via INTRAVENOUS
  Administered 2018-03-28: 20:00:00 50 mg via INTRAVENOUS

## 2018-03-28 MED ORDER — ROPIVACAINE (PF) 5 MG/ML (0.5 %) IJ SOLN
0 refills | Status: CP
Start: 2018-03-28 — End: ?

## 2018-03-28 MED ORDER — METHYLNALTREXONE 12 MG/0.6 ML SC SOLN
12 mg | Freq: Once | SUBCUTANEOUS | 0 refills | Status: CP
Start: 2018-03-28 — End: ?
  Administered 2018-03-28: 17:00:00 12 mg via SUBCUTANEOUS

## 2018-03-28 MED ORDER — LACTATED RINGERS IV SOLP
1000 mL | Freq: Once | INTRAVENOUS | 0 refills | Status: DC
Start: 2018-03-28 — End: 2018-03-29

## 2018-03-28 MED ORDER — BUPIVACAINE 0.5 % (5 MG/ML) IJ SOLN
0 refills | Status: DC
Start: 2018-03-28 — End: 2018-03-28
  Administered 2018-03-28: 23:00:00 30 mL via INTRAMUSCULAR

## 2018-03-28 MED ORDER — OXYCODONE 5 MG PO TAB
5-10 mg | Freq: Once | ORAL | 0 refills | Status: DC | PRN
Start: 2018-03-28 — End: 2018-03-29

## 2018-03-28 MED ORDER — CEFAZOLIN 1 GRAM IJ SOLR
0 refills | Status: DC
Start: 2018-03-28 — End: 2018-03-28
  Administered 2018-03-28: 21:00:00 2 g via INTRAVENOUS

## 2018-03-28 MED ORDER — LACTATED RINGERS IV SOLP
0 refills | Status: DC
Start: 2018-03-28 — End: 2018-03-28
  Administered 2018-03-28 (×2): via INTRAVENOUS

## 2018-03-28 MED ORDER — BUPIVACAINE 0.125% PCA PNC SYR
PERINEURAL | 0 refills | Status: DC
Start: 2018-03-28 — End: 2018-04-03
  Administered 2018-03-29 – 2018-04-02 (×25): 50.000 mL via PERINEURAL

## 2018-03-28 MED ORDER — THROMBIN (BOVINE) 5,000 UNIT TP SOLR
0 refills | Status: DC
Start: 2018-03-28 — End: 2018-03-28
  Administered 2018-03-28: 22:00:00 5000 [IU] via TOPICAL

## 2018-03-28 MED ORDER — PHENYLEPHRINE IV DRIP (DBL CONC)
0 refills | Status: DC
Start: 2018-03-28 — End: 2018-03-28
  Administered 2018-03-28 (×2): 0.4 ug/kg/min via INTRAVENOUS

## 2018-03-28 MED ORDER — KETAMINE 10 MG/ML IJ SOLN
0 refills | Status: DC
Start: 2018-03-28 — End: 2018-03-28
  Administered 2018-03-28: 21:00:00 30 mg via INTRAVENOUS
  Administered 2018-03-28 (×2): 15 mg via INTRAVENOUS
  Administered 2018-03-28: 21:00:00 30 mg via INTRAVENOUS

## 2018-03-28 MED ORDER — PROMETHAZINE 25 MG/ML IJ SOLN
6.25 mg | INTRAVENOUS | 0 refills | Status: DC | PRN
Start: 2018-03-28 — End: 2018-03-29

## 2018-03-28 MED ORDER — POTASSIUM CHLORIDE IN WATER 10 MEQ/50 ML IV PGBK
10 meq | INTRAVENOUS | 0 refills | Status: AC
Start: 2018-03-28 — End: ?
  Administered 2018-03-28 (×3): 10 meq via INTRAVENOUS

## 2018-03-28 MED ORDER — MEPERIDINE (PF) 25 MG/ML IJ SYRG
12.5 mg | INTRAVENOUS | 0 refills | Status: DC | PRN
Start: 2018-03-28 — End: 2018-03-29

## 2018-03-28 MED ADMIN — SODIUM CHLORIDE 0.9 % IV SOLP [27838]: 250 mL | INTRAVENOUS | @ 15:00:00 | Stop: 2018-03-28 | NDC 00338004902

## 2018-03-29 ENCOUNTER — Inpatient Hospital Stay: Admit: 2018-03-29 | Discharge: 2018-03-29 | Payer: BC Managed Care – PPO

## 2018-03-29 DIAGNOSIS — S82871A Displaced pilon fracture of right tibia, initial encounter for closed fracture: Principal | ICD-10-CM

## 2018-03-29 LAB — CBC
Lab: 10 g/dL — ABNORMAL LOW (ref 13.5–16.5)
Lab: 13 % (ref 11–15)
Lab: 3.4 M/UL — ABNORMAL LOW (ref 4.4–5.5)
Lab: 30 pg (ref 26–34)
Lab: 31 % — ABNORMAL LOW (ref 40–50)
Lab: 593 10*3/uL — ABNORMAL HIGH (ref 150–400)
Lab: 6.5 FL — ABNORMAL LOW (ref 7–11)
Lab: 9.6 10*3/uL (ref 4.5–11.0)
Lab: 91 FL (ref 80–100)

## 2018-03-29 LAB — MAGNESIUM: Lab: 2.3 mg/dL — ABNORMAL LOW (ref >60)

## 2018-03-29 LAB — POTASSIUM
Lab: 3.9 MMOL/L (ref 3.5–5.1)
Lab: 3.2 MMOL/L — ABNORMAL LOW (ref 3.5–5.1)

## 2018-03-29 LAB — 25-OH VITAMIN D (D2 + D3): Lab: 13 ng/mL — ABNORMAL LOW (ref >60)

## 2018-03-29 LAB — BASIC METABOLIC PANEL: Lab: 143 MMOL/L — ABNORMAL LOW (ref 137–147)

## 2018-03-29 LAB — PHOSPHORUS: Lab: 3.9 mg/dL — ABNORMAL LOW (ref >60)

## 2018-03-29 MED ORDER — LACTATED RINGERS IV SOLP
INTRAVENOUS | 0 refills | Status: DC
Start: 2018-03-29 — End: 2018-03-31
  Administered 2018-03-30 – 2018-03-31 (×3): 1000.000 mL via INTRAVENOUS

## 2018-03-29 MED ORDER — FENTANYL CITRATE (PF) 50 MCG/ML IJ SOLN
25-50 ug | INTRAVENOUS | 0 refills | Status: DC | PRN
Start: 2018-03-29 — End: 2018-03-30

## 2018-03-29 MED ORDER — ENOXAPARIN 30 MG/0.3 ML SC SYRG
30 mg | Freq: Two times a day (BID) | SUBCUTANEOUS | 0 refills | Status: DC
Start: 2018-03-29 — End: 2018-04-04
  Administered 2018-03-30 – 2018-04-04 (×11): 30 mg via SUBCUTANEOUS

## 2018-03-29 MED ORDER — GABAPENTIN 250 MG/5 ML PO SOLN
100 mg | Freq: Every evening | NASOGASTRIC | 0 refills | Status: DC
Start: 2018-03-29 — End: 2018-03-31
  Administered 2018-03-31: 03:00:00 100 mg via NASOGASTRIC

## 2018-03-29 MED ORDER — ENOXAPARIN 40 MG/0.4 ML SC SYRG
40 mg | Freq: Every day | SUBCUTANEOUS | 0 refills | Status: DC
Start: 2018-03-29 — End: 2018-03-29

## 2018-03-29 MED ORDER — ACETAMINOPHEN 160 MG/5 ML PO SOLN
1000 mg | ORAL | 0 refills | Status: DC
Start: 2018-03-29 — End: 2018-04-01
  Administered 2018-03-29 – 2018-04-01 (×7): 1000 mg via ORAL

## 2018-03-29 MED ORDER — OXYCODONE 5 MG/5 ML PO SOLN
5-10 mg | ORAL | 0 refills | Status: DC | PRN
Start: 2018-03-29 — End: 2018-03-31
  Administered 2018-03-29: 19:00:00 5 mg via ORAL
  Administered 2018-03-30 – 2018-03-31 (×4): 10 mg via ORAL

## 2018-03-29 MED ORDER — SENNA-DOCUSATE 8.8/50MG/10ML PO SOLN
20 mL | Freq: Two times a day (BID) | NASOGASTRIC | 0 refills | Status: DC
Start: 2018-03-29 — End: 2018-03-31

## 2018-03-29 MED ORDER — POTASSIUM CHLORIDE IN WATER 10 MEQ/50 ML IV PGBK
10 meq | INTRAVENOUS | 0 refills | Status: CP
Start: 2018-03-29 — End: ?
  Administered 2018-03-29 – 2018-03-30 (×5): 10 meq via INTRAVENOUS

## 2018-03-29 MED ORDER — PATCH DOCUMENTATION - LIDOCAINE 5%
Freq: Two times a day (BID) | TRANSDERMAL | 0 refills | Status: DC
Start: 2018-03-29 — End: 2018-04-04

## 2018-03-29 MED ORDER — GABAPENTIN 300 MG PO CAP
300 mg | ORAL | 0 refills | Status: DC
Start: 2018-03-29 — End: 2018-03-29

## 2018-03-29 MED ORDER — LIDOCAINE 5 % TP PTMD
1-2 | Freq: Every day | TOPICAL | 0 refills | Status: DC
Start: 2018-03-29 — End: 2018-04-04
  Administered 2018-03-30 – 2018-04-04 (×6): 1 via TOPICAL

## 2018-03-29 MED ORDER — CEFAZOLIN INJ 1GM IVP
1 g | INTRAVENOUS | 0 refills | Status: CP
Start: 2018-03-29 — End: ?
  Administered 2018-03-29 – 2018-03-30 (×3): 1 g via INTRAVENOUS

## 2018-03-29 MED ORDER — GABAPENTIN 100 MG PO CAP
100 mg | Freq: Every evening | ORAL | 0 refills | Status: DC
Start: 2018-03-29 — End: 2018-03-29

## 2018-03-29 MED ORDER — CHOLECALCIFEROL (VITAMIN D3) 10 MCG (400 UNIT) PO TAB
400 [IU] | Freq: Every day | ORAL | 0 refills | Status: DC
Start: 2018-03-29 — End: 2018-03-30
  Administered 2018-03-30: 17:00:00 400 [IU] via ORAL

## 2018-03-29 MED ORDER — POTASSIUM CHLORIDE 20 MEQ PO TBTQ
40 meq | Freq: Once | ORAL | 0 refills | Status: AC
Start: 2018-03-29 — End: ?

## 2018-03-29 MED ORDER — POTASSIUM CHLORIDE IN WATER 10 MEQ/50 ML IV PGBK
10 meq | INTRAVENOUS | 0 refills | Status: CP
Start: 2018-03-29 — End: ?
  Administered 2018-03-30 (×4): 10 meq via INTRAVENOUS

## 2018-03-29 MED ADMIN — SODIUM CHLORIDE 0.9 % IV SOLP [27838]: 250 mL | INTRAVENOUS | @ 21:00:00 | Stop: 2018-03-29 | NDC 00338004902

## 2018-03-30 ENCOUNTER — Inpatient Hospital Stay: Admit: 2018-03-30 | Discharge: 2018-03-30 | Payer: BC Managed Care – PPO

## 2018-03-30 DIAGNOSIS — S82871A Displaced pilon fracture of right tibia, initial encounter for closed fracture: Principal | ICD-10-CM

## 2018-03-30 LAB — BASIC METABOLIC PANEL: Lab: 146 MMOL/L — ABNORMAL LOW (ref >60)

## 2018-03-30 LAB — PHOSPHORUS: Lab: 3.1 mg/dL — ABNORMAL HIGH (ref 2.0–4.5)

## 2018-03-30 LAB — MAGNESIUM: Lab: 2.4 mg/dL — ABNORMAL LOW (ref 1.6–2.6)

## 2018-03-30 LAB — CBC: Lab: 10 K/UL — ABNORMAL HIGH (ref >60)

## 2018-03-30 MED ORDER — POTASSIUM CHLORIDE IN WATER 10 MEQ/50 ML IV PGBK
10 meq | INTRAVENOUS | 0 refills | Status: CP
Start: 2018-03-30 — End: ?
  Administered 2018-03-30 – 2018-03-31 (×5): 10 meq via INTRAVENOUS

## 2018-03-30 MED ORDER — ERGOCALCIFEROL (VITAMIN D2) 1,250 MCG (50,000 UNIT) PO CAP
50000 [IU] | ORAL | 0 refills | Status: DC
Start: 2018-03-30 — End: 2018-04-04
  Administered 2018-04-02 – 2018-04-04 (×2): 50000 [IU] via ORAL

## 2018-03-30 MED ORDER — METHOCARBAMOL 500 MG PO TAB
500 mg | Freq: Three times a day (TID) | ORAL | 0 refills | Status: DC
Start: 2018-03-30 — End: 2018-03-31
  Administered 2018-03-30 – 2018-03-31 (×3): 500 mg via ORAL

## 2018-03-31 ENCOUNTER — Encounter: Admit: 2018-03-31 | Discharge: 2018-03-31 | Payer: BC Managed Care – PPO

## 2018-03-31 DIAGNOSIS — S82871A Displaced pilon fracture of right tibia, initial encounter for closed fracture: Principal | ICD-10-CM

## 2018-03-31 DIAGNOSIS — E119 Type 2 diabetes mellitus without complications: Principal | ICD-10-CM

## 2018-03-31 LAB — BASIC METABOLIC PANEL
Lab: 148 MMOL/L — ABNORMAL HIGH (ref >60)
Lab: 0.5 mg/dL — ABNORMAL LOW (ref >60)
Lab: 112 MMOL/L — ABNORMAL HIGH (ref 23–31)
Lab: 12 mg/dL (ref 7–25)
Lab: 126 mg/dL — ABNORMAL HIGH (ref 70–105)
Lab: 150 MMOL/L — ABNORMAL HIGH (ref 81.1–96)
Lab: 27 MMOL/L — ABNORMAL HIGH (ref 8–26)
Lab: 3.2 MMOL/L — ABNORMAL LOW (ref 27–31.2)
Lab: 8.7 mg/dL — ABNORMAL LOW (ref >60)
Lab: >60 mL/min (ref >60)
Lab: >60 mL/min (ref >60)
Lab: 11 — ABNORMAL HIGH (ref 0.7–1.3)

## 2018-03-31 LAB — CBC: Lab: 8.2 K/UL — ABNORMAL LOW (ref 4.5–11.0)

## 2018-03-31 LAB — TROPONIN-I: Lab: 0 ng/mL (ref 0.0–0.05)

## 2018-03-31 LAB — C DIFFICILE BY PCR: Lab: NEGATIVE

## 2018-03-31 LAB — MAGNESIUM: Lab: 2.3 mg/dL — ABNORMAL LOW (ref >60)

## 2018-03-31 LAB — PHOSPHORUS: Lab: 2.6 mg/dL — ABNORMAL HIGH (ref 2.0–4.5)

## 2018-03-31 LAB — POTASSIUM: Lab: 3.5 MMOL/L — ABNORMAL LOW (ref >60)

## 2018-03-31 MED ORDER — POTASSIUM CHLORIDE IN WATER 10 MEQ/50 ML IV PGBK
10 meq | INTRAVENOUS | 0 refills | Status: CP
Start: 2018-03-31 — End: ?
  Administered 2018-03-31 (×3): 10 meq via INTRAVENOUS

## 2018-03-31 MED ORDER — POTASSIUM CHLORIDE 20 MEQ PO TBTQ
40 meq | Freq: Once | ORAL | 0 refills | Status: DC
Start: 2018-03-31 — End: 2018-03-31

## 2018-03-31 MED ORDER — LR WITH POTASSIUM CHLORIDE 40 MEQ/L IV INFUSION
INTRAVENOUS | 0 refills | Status: DC
Start: 2018-03-31 — End: 2018-04-04
  Administered 2018-03-31 – 2018-04-04 (×18): 1000.000 mL via INTRAVENOUS

## 2018-03-31 MED ORDER — METHYLNALTREXONE 12 MG/0.6 ML SC SOLN
12 mg | Freq: Once | SUBCUTANEOUS | 0 refills | Status: CP
Start: 2018-03-31 — End: ?
  Administered 2018-03-31: 18:00:00 12 mg via SUBCUTANEOUS

## 2018-03-31 MED ORDER — PANCRELIPASE-SODIUM BICARBONATE 20,880 K UNIT-650 MG
NASOGASTRIC | 0 refills | Status: DC | PRN
Start: 2018-03-31 — End: 2018-04-04

## 2018-03-31 MED ORDER — TRAMADOL 50 MG PO TAB
50-100 mg | ORAL | 0 refills | Status: DC | PRN
Start: 2018-03-31 — End: 2018-04-04
  Administered 2018-04-02 – 2018-04-03 (×5): 50 mg via ORAL
  Administered 2018-04-03 – 2018-04-04 (×2): 100 mg via ORAL

## 2018-03-31 MED ORDER — CYCLOBENZAPRINE 10 MG PO TAB
10 mg | Freq: Three times a day (TID) | ORAL | 0 refills | Status: DC | PRN
Start: 2018-03-31 — End: 2018-04-04
  Administered 2018-03-31 – 2018-04-04 (×3): 10 mg via ORAL

## 2018-03-31 MED ORDER — POTASSIUM PHOSPHATE IVPB-STD
15 MMOL | Freq: Once | INTRAVENOUS | 0 refills | Status: CP
Start: 2018-03-31 — End: ?
  Administered 2018-03-31 (×2): 15 mmol via INTRAVENOUS

## 2018-03-31 MED ORDER — GABAPENTIN 100 MG PO CAP
100 mg | Freq: Every evening | ORAL | 0 refills | Status: DC
Start: 2018-03-31 — End: 2018-04-04
  Administered 2018-04-01 – 2018-04-04 (×4): 100 mg via ORAL

## 2018-03-31 MED ORDER — MILK/MOLASSES(#) 1:1 RECTAL ENEMA
1 | Freq: Two times a day (BID) | RECTAL | 0 refills | Status: DC
Start: 2018-03-31 — End: 2018-04-04
  Administered 2018-03-31: 17:00:00 200 mL via RECTAL

## 2018-03-31 MED ORDER — POTASSIUM CHLORIDE IN WATER 10 MEQ/50 ML IV PGBK
10 meq | INTRAVENOUS | 0 refills | Status: CP
Start: 2018-03-31 — End: ?
  Administered 2018-03-31 – 2018-04-01 (×4): 10 meq via INTRAVENOUS

## 2018-03-31 MED ORDER — POTASSIUM CHLORIDE 20 MEQ/15 ML PO LIQD
40 meq | Freq: Once | NASOGASTRIC | 0 refills | Status: CP
Start: 2018-03-31 — End: ?
  Administered 2018-03-31: 40 meq via NASOGASTRIC

## 2018-03-31 MED ORDER — POTASSIUM CHLORIDE IN WATER 10 MEQ/50 ML IV PGBK
10 meq | INTRAVENOUS | 0 refills | Status: DC
Start: 2018-03-31 — End: 2018-03-31

## 2018-04-01 ENCOUNTER — Inpatient Hospital Stay: Admit: 2018-03-18 | Discharge: 2018-03-18 | Payer: BC Managed Care – PPO

## 2018-04-01 DIAGNOSIS — S82871A Displaced pilon fracture of right tibia, initial encounter for closed fracture: Principal | ICD-10-CM

## 2018-04-01 LAB — PHOSPHORUS: Lab: 3.3 mg/dL — ABNORMAL LOW (ref 2.0–4.5)

## 2018-04-01 LAB — BASIC METABOLIC PANEL
Lab: 0.5 mg/dL (ref 0.4–1.24)
Lab: 105 mg/dL — ABNORMAL HIGH (ref 70–100)
Lab: 106 MMOL/L (ref 98–110)
Lab: 142 MMOL/L (ref 137–147)
Lab: 146 MMOL/L — ABNORMAL LOW (ref 137–147)
Lab: 27 MMOL/L (ref 21–30)
Lab: 3.7 MMOL/L (ref 3.5–5.1)
Lab: 8 mg/dL (ref 7–25)
Lab: 8.2 mg/dL — ABNORMAL LOW (ref 8.5–10.6)
Lab: 9 (ref 3–12)
Lab: >60 mL/min (ref >60)
Lab: >60 mL/min (ref >60)

## 2018-04-01 LAB — MAGNESIUM: Lab: 2 mg/dL — ABNORMAL LOW (ref >60)

## 2018-04-01 LAB — CBC: Lab: 7.5 K/UL — ABNORMAL LOW (ref >60)

## 2018-04-01 MED ORDER — SENNOSIDES-DOCUSATE SODIUM 8.6-50 MG PO TAB
1 | Freq: Two times a day (BID) | ORAL | 0 refills | Status: DC
Start: 2018-04-01 — End: 2018-04-04
  Administered 2018-04-02 – 2018-04-04 (×5): 1 via ORAL

## 2018-04-01 MED ORDER — ACETAMINOPHEN 500 MG PO TAB
1000 mg | ORAL | 0 refills | Status: DC
Start: 2018-04-01 — End: 2018-04-04
  Administered 2018-04-02 – 2018-04-04 (×8): 1000 mg via ORAL

## 2018-04-01 MED ORDER — ACETAMINOPHEN 500 MG PO TAB
1000 mg | ORAL | 0 refills | Status: DC | PRN
Start: 2018-04-01 — End: 2018-04-01

## 2018-04-01 MED ORDER — POLYETHYLENE GLYCOL 3350 17 GRAM PO PWPK
1 | Freq: Every day | ORAL | 0 refills | Status: DC
Start: 2018-04-01 — End: 2018-04-04
  Administered 2018-04-02 – 2018-04-04 (×3): 17 g via ORAL

## 2018-04-01 MED ORDER — POTASSIUM CHLORIDE 20 MEQ PO TBTQ
40 meq | Freq: Once | ORAL | 0 refills | Status: CP
Start: 2018-04-01 — End: ?
  Administered 2018-04-01: 15:00:00 40 meq via ORAL

## 2018-04-01 MED ORDER — POTASSIUM CHLORIDE IN WATER 10 MEQ/50 ML IV PGBK
10 meq | INTRAVENOUS | 0 refills | Status: CP
Start: 2018-04-01 — End: ?
  Administered 2018-04-01 (×4): 10 meq via INTRAVENOUS

## 2018-04-02 LAB — MAGNESIUM: Lab: 1.8 mg/dL — ABNORMAL HIGH (ref >60)

## 2018-04-02 LAB — BASIC METABOLIC PANEL
Lab: 143 MMOL/L — ABNORMAL LOW (ref 137–147)
Lab: 0.5 mg/dL — ABNORMAL LOW (ref 0.4–1.24)
Lab: 103 MMOL/L — ABNORMAL LOW (ref 98–110)
Lab: 120 mg/dL — ABNORMAL HIGH (ref 70–100)
Lab: 137 MMOL/L — ABNORMAL LOW (ref 137–147)
Lab: 26 MMOL/L (ref 21–30)
Lab: 3.3 MMOL/L — ABNORMAL LOW (ref 3.5–5.1)
Lab: 5 mg/dL — ABNORMAL LOW (ref 7–25)
Lab: 8 pg (ref 3–12)
Lab: 8.2 mg/dL — ABNORMAL LOW (ref 8.5–10.6)
Lab: >60 mL/min — ABNORMAL HIGH (ref >60)

## 2018-04-02 LAB — PHOSPHORUS: Lab: 3.3 mg/dL — ABNORMAL LOW (ref 2.0–4.5)

## 2018-04-02 LAB — CBC: Lab: 8.3 10*3/uL — ABNORMAL LOW (ref >60)

## 2018-04-02 MED ORDER — MAGNESIUM SULFATE IN D5W 1 GRAM/100 ML IV PGBK
1 g | INTRAVENOUS | 0 refills | Status: CP
Start: 2018-04-02 — End: ?
  Administered 2018-04-02 (×2): 1 g via INTRAVENOUS

## 2018-04-02 MED ORDER — POTASSIUM CHLORIDE 20 MEQ PO TBTQ
40 meq | Freq: Once | ORAL | 0 refills | Status: CP
Start: 2018-04-02 — End: ?
  Administered 2018-04-02: 18:00:00 40 meq via ORAL

## 2018-04-02 MED ORDER — OXYCODONE 5 MG PO TAB
5-15 mg | ORAL | 0 refills | Status: DC | PRN
Start: 2018-04-02 — End: 2018-04-04

## 2018-04-02 MED ORDER — POTASSIUM CHLORIDE IN WATER 10 MEQ/50 ML IV PGBK
10 meq | INTRAVENOUS | 0 refills | Status: CP
Start: 2018-04-02 — End: ?
  Administered 2018-04-02 – 2018-04-03 (×4): 10 meq via INTRAVENOUS

## 2018-04-02 MED ORDER — POTASSIUM CHL SR TAB-MG/AL HYD 30ML
Freq: Once | ORAL | 0 refills | Status: CP
Start: 2018-04-02 — End: ?
  Administered 2018-04-03 (×2): 30.000 mL via ORAL

## 2018-04-03 MED ORDER — POTASSIUM CHLORIDE IN WATER 10 MEQ/50 ML IV PGBK
10 meq | INTRAVENOUS | 0 refills | Status: CP
Start: 2018-04-03 — End: ?
  Administered 2018-04-03 (×4): 10 meq via INTRAVENOUS

## 2018-04-04 ENCOUNTER — Inpatient Hospital Stay: Admit: 2018-03-22 | Discharge: 2018-03-22 | Payer: BC Managed Care – PPO

## 2018-04-04 ENCOUNTER — Inpatient Hospital Stay: Admit: 2018-04-02 | Discharge: 2018-04-02 | Payer: BC Managed Care – PPO

## 2018-04-04 ENCOUNTER — Inpatient Hospital Stay: Admit: 2018-03-28 | Discharge: 2018-03-28 | Payer: BC Managed Care – PPO

## 2018-04-04 ENCOUNTER — Inpatient Hospital Stay: Admit: 2018-03-29 | Discharge: 2018-03-29 | Payer: BC Managed Care – PPO

## 2018-04-04 ENCOUNTER — Inpatient Hospital Stay: Admit: 2018-03-18 | Discharge: 2018-03-18 | Payer: BC Managed Care – PPO

## 2018-04-04 ENCOUNTER — Inpatient Hospital Stay
Admit: 2018-03-11 | Discharge: 2018-04-04 | Disposition: A | Discharge status: Home Health | Payer: BC Managed Care – PPO | Source: Other Acute Inpatient Hospital

## 2018-04-04 ENCOUNTER — Inpatient Hospital Stay: Admit: 2018-03-27 | Discharge: 2018-03-27 | Payer: BC Managed Care – PPO

## 2018-04-04 ENCOUNTER — Inpatient Hospital Stay: Admit: 2018-03-16 | Discharge: 2018-03-16 | Payer: BC Managed Care – PPO

## 2018-04-04 ENCOUNTER — Inpatient Hospital Stay: Admit: 2018-03-10 | Discharge: 2018-03-10 | Payer: BC Managed Care – PPO

## 2018-04-04 ENCOUNTER — Inpatient Hospital Stay: Admit: 2018-03-11 | Discharge: 2018-03-11 | Payer: BC Managed Care – PPO

## 2018-04-04 ENCOUNTER — Inpatient Hospital Stay: Admit: 2018-04-03 | Discharge: 2018-04-03 | Payer: BC Managed Care – PPO

## 2018-04-04 ENCOUNTER — Inpatient Hospital Stay: Admit: 2018-03-31 | Discharge: 2018-03-31 | Payer: BC Managed Care – PPO

## 2018-04-04 ENCOUNTER — Inpatient Hospital Stay: Admit: 2018-03-17 | Discharge: 2018-03-17 | Payer: BC Managed Care – PPO

## 2018-04-04 ENCOUNTER — Inpatient Hospital Stay: Admit: 2018-03-21 | Discharge: 2018-03-21 | Payer: BC Managed Care – PPO

## 2018-04-04 ENCOUNTER — Inpatient Hospital Stay: Admit: 2018-04-01 | Discharge: 2018-04-01 | Payer: BC Managed Care – PPO

## 2018-04-04 ENCOUNTER — Inpatient Hospital Stay: Admit: 2018-03-19 | Discharge: 2018-03-19 | Payer: BC Managed Care – PPO

## 2018-04-04 ENCOUNTER — Emergency Department: Admit: 2018-03-10 | Discharge: 2018-03-10 | Payer: BC Managed Care – PPO

## 2018-04-04 ENCOUNTER — Inpatient Hospital Stay: Admit: 2018-03-14 | Discharge: 2018-03-14 | Payer: BC Managed Care – PPO

## 2018-04-04 ENCOUNTER — Inpatient Hospital Stay: Admit: 2018-03-26 | Discharge: 2018-03-26 | Payer: BC Managed Care – PPO

## 2018-04-04 ENCOUNTER — Inpatient Hospital Stay: Admit: 2018-03-15 | Discharge: 2018-03-15 | Payer: BC Managed Care – PPO

## 2018-04-04 DIAGNOSIS — D62 Acute posthemorrhagic anemia: ICD-10-CM

## 2018-04-04 DIAGNOSIS — E559 Vitamin D deficiency, unspecified: ICD-10-CM

## 2018-04-04 DIAGNOSIS — Z79899 Other long term (current) drug therapy: ICD-10-CM

## 2018-04-04 DIAGNOSIS — R0689 Other abnormalities of breathing: ICD-10-CM

## 2018-04-04 DIAGNOSIS — K567 Ileus, unspecified: ICD-10-CM

## 2018-04-04 DIAGNOSIS — S2222XA Fracture of body of sternum, initial encounter for closed fracture: ICD-10-CM

## 2018-04-04 DIAGNOSIS — Z6825 Body mass index (BMI) 25.0-25.9, adult: ICD-10-CM

## 2018-04-04 DIAGNOSIS — Z7409 Other reduced mobility: ICD-10-CM

## 2018-04-04 DIAGNOSIS — S2241XA Multiple fractures of ribs, right side, initial encounter for closed fracture: ICD-10-CM

## 2018-04-04 DIAGNOSIS — R339 Retention of urine, unspecified: ICD-10-CM

## 2018-04-04 DIAGNOSIS — M4856XA Collapsed vertebra, not elsewhere classified, lumbar region, initial encounter for fracture: ICD-10-CM

## 2018-04-04 DIAGNOSIS — R197 Diarrhea, unspecified: ICD-10-CM

## 2018-04-04 DIAGNOSIS — I10 Essential (primary) hypertension: ICD-10-CM

## 2018-04-04 DIAGNOSIS — G479 Sleep disorder, unspecified: ICD-10-CM

## 2018-04-04 DIAGNOSIS — J9601 Acute respiratory failure with hypoxia: ICD-10-CM

## 2018-04-04 DIAGNOSIS — E785 Hyperlipidemia, unspecified: ICD-10-CM

## 2018-04-04 DIAGNOSIS — T797XXA Traumatic subcutaneous emphysema, initial encounter: ICD-10-CM

## 2018-04-04 DIAGNOSIS — S82871A Displaced pilon fracture of right tibia, initial encounter for closed fracture: Principal | ICD-10-CM

## 2018-04-04 DIAGNOSIS — S272XXA Traumatic hemopneumothorax, initial encounter: ICD-10-CM

## 2018-04-04 DIAGNOSIS — G8911 Acute pain due to trauma: ICD-10-CM

## 2018-04-04 DIAGNOSIS — F329 Major depressive disorder, single episode, unspecified: ICD-10-CM

## 2018-04-04 DIAGNOSIS — E876 Hypokalemia: ICD-10-CM

## 2018-04-04 DIAGNOSIS — K219 Gastro-esophageal reflux disease without esophagitis: ICD-10-CM

## 2018-04-04 DIAGNOSIS — F419 Anxiety disorder, unspecified: ICD-10-CM

## 2018-04-04 LAB — BASIC METABOLIC PANEL
Lab: 0.6 mg/dL (ref 0.4–1.24)
Lab: 103 MMOL/L (ref >60)
Lab: 103 mg/dL — ABNORMAL HIGH (ref 70–100)
Lab: 11 (ref 3–12)
Lab: 135 MMOL/L — ABNORMAL LOW (ref 137–147)
Lab: 21 MMOL/L (ref 21–30)
Lab: 3.9 MMOL/L (ref >60)
Lab: 7 mg/dL (ref 7–25)
Lab: 8.2 mg/dL — ABNORMAL LOW (ref 8.5–10.6)
Lab: >60 mL/min (ref >60)
Lab: >60 mL/min (ref >60)

## 2018-04-04 MED ORDER — POTASSIUM CHLORIDE 20 MEQ PO TBTQ
40 meq | Freq: Once | ORAL | 0 refills | Status: CP
Start: 2018-04-04 — End: ?
  Administered 2018-04-04: 18:00:00 40 meq via ORAL

## 2018-04-04 MED ORDER — CYCLOBENZAPRINE 10 MG PO TAB
10 mg | ORAL_TABLET | Freq: Three times a day (TID) | ORAL | 0 refills | 30.00000 days | Status: AC | PRN
Start: 2018-04-04 — End: 2018-04-15

## 2018-04-04 MED ORDER — SENNOSIDES-DOCUSATE SODIUM 8.6-50 MG PO TAB
1 | Freq: Two times a day (BID) | ORAL | 0 refills | Status: AC
Start: 2018-04-04 — End: 2018-08-26

## 2018-04-04 MED ORDER — ERGOCALCIFEROL (VITAMIN D2) 1,250 MCG (50,000 UNIT) PO CAP
1 | ORAL_CAPSULE | ORAL | 0 refills | 56.00000 days | Status: AC
Start: 2018-04-04 — End: 2018-04-22

## 2018-04-04 MED ORDER — ASPIRIN 325 MG PO TAB
325 mg | ORAL_TABLET | Freq: Every day | ORAL | 0 refills | 30.00000 days | Status: AC
Start: 2018-04-04 — End: 2018-08-26

## 2018-04-04 MED ORDER — TRAZODONE 50 MG PO TAB
50 mg | ORAL_TABLET | Freq: Every evening | ORAL | 0 refills | Status: AC
Start: 2018-04-04 — End: 2018-08-26

## 2018-04-04 MED ORDER — BISACODYL 10 MG RE SUPP
10 mg | Freq: Every day | RECTAL | 0 refills | 1.00000 days | Status: AC
Start: 2018-04-04 — End: 2018-04-15

## 2018-04-04 MED ORDER — POLYETHYLENE GLYCOL 3350 17 GRAM PO PWPK
17 g | Freq: Every day | ORAL | 0 refills | 18.00000 days | Status: AC
Start: 2018-04-04 — End: 2018-08-26

## 2018-04-04 MED ORDER — ACETAMINOPHEN 500 MG PO TAB
1000 mg | ORAL | 0 refills | Status: AC | PRN
Start: 2018-04-04 — End: ?

## 2018-04-04 MED ORDER — TRAMADOL 50 MG PO TAB
50-100 mg | ORAL_TABLET | ORAL | 0 refills | Status: AC | PRN
Start: 2018-04-04 — End: 2018-08-26

## 2018-04-04 MED ORDER — LIDOCAINE 5 % TP PTMD
1-2 | MEDICATED_PATCH | Freq: Every day | TOPICAL | 0 refills | 30.00000 days | Status: AC
Start: 2018-04-04 — End: 2018-08-26

## 2018-04-08 ENCOUNTER — Ambulatory Visit: Admit: 2018-04-08 | Discharge: 2018-04-09 | Payer: BC Managed Care – PPO

## 2018-04-08 DIAGNOSIS — S82891A Other fracture of right lower leg, initial encounter for closed fracture: Principal | ICD-10-CM

## 2018-04-15 ENCOUNTER — Ambulatory Visit: Admit: 2018-04-15 | Discharge: 2018-04-16 | Payer: BC Managed Care – PPO

## 2018-04-15 ENCOUNTER — Encounter: Admit: 2018-04-15 | Discharge: 2018-04-15 | Payer: BC Managed Care – PPO

## 2018-04-15 DIAGNOSIS — W19XXXD Unspecified fall, subsequent encounter: Secondary | ICD-10-CM

## 2018-04-15 DIAGNOSIS — S82891A Other fracture of right lower leg, initial encounter for closed fracture: Secondary | ICD-10-CM

## 2018-04-15 DIAGNOSIS — S2241XD Multiple fractures of ribs, right side, subsequent encounter for fracture with routine healing: Secondary | ICD-10-CM

## 2018-04-15 DIAGNOSIS — E119 Type 2 diabetes mellitus without complications: Secondary | ICD-10-CM

## 2018-04-15 DIAGNOSIS — S2220XD Unspecified fracture of sternum, subsequent encounter for fracture with routine healing: Secondary | ICD-10-CM

## 2018-04-15 DIAGNOSIS — S272XXD Traumatic hemopneumothorax, subsequent encounter: Secondary | ICD-10-CM

## 2018-04-15 DIAGNOSIS — S32009D Unspecified fracture of unspecified lumbar vertebra, subsequent encounter for fracture with routine healing: Secondary | ICD-10-CM

## 2018-04-19 ENCOUNTER — Encounter: Admit: 2018-04-19 | Discharge: 2018-04-19 | Payer: BC Managed Care – PPO

## 2018-04-19 DIAGNOSIS — M25571 Pain in right ankle and joints of right foot: Secondary | ICD-10-CM

## 2018-04-22 ENCOUNTER — Encounter: Admit: 2018-04-22 | Discharge: 2018-04-22 | Payer: BC Managed Care – PPO

## 2018-04-22 ENCOUNTER — Ambulatory Visit: Admit: 2018-04-22 | Discharge: 2018-04-22 | Payer: BC Managed Care – PPO

## 2018-04-22 DIAGNOSIS — M25571 Pain in right ankle and joints of right foot: Secondary | ICD-10-CM

## 2018-04-22 DIAGNOSIS — E119 Type 2 diabetes mellitus without complications: Secondary | ICD-10-CM

## 2018-04-22 DIAGNOSIS — S82891A Other fracture of right lower leg, initial encounter for closed fracture: ICD-10-CM

## 2018-04-22 MED ORDER — ERGOCALCIFEROL (VITAMIN D2) 1,250 MCG (50,000 UNIT) PO CAP
1 | ORAL_CAPSULE | ORAL | 0 refills | 56.00000 days | Status: AC
Start: 2018-04-22 — End: 2018-05-15

## 2018-04-29 ENCOUNTER — Encounter: Admit: 2018-04-29 | Discharge: 2018-04-29 | Payer: BC Managed Care – PPO

## 2018-04-29 ENCOUNTER — Ambulatory Visit: Admit: 2018-04-29 | Discharge: 2018-04-30 | Payer: BC Managed Care – PPO

## 2018-04-29 DIAGNOSIS — Z760 Encounter for issue of repeat prescription: Secondary | ICD-10-CM

## 2018-04-29 DIAGNOSIS — E119 Type 2 diabetes mellitus without complications: Secondary | ICD-10-CM

## 2018-04-29 MED ORDER — TRAMADOL 50 MG PO TAB
50 mg | ORAL_TABLET | ORAL | 0 refills | Status: AC | PRN
Start: 2018-04-29 — End: 2018-08-26

## 2018-04-29 MED ORDER — CYCLOBENZAPRINE 10 MG PO TAB
10 mg | ORAL_TABLET | Freq: Three times a day (TID) | ORAL | 0 refills | 30.00000 days | Status: AC | PRN
Start: 2018-04-29 — End: 2018-08-26

## 2018-05-15 ENCOUNTER — Encounter: Admit: 2018-05-15 | Discharge: 2018-05-15 | Payer: BC Managed Care – PPO

## 2018-05-15 MED ORDER — ERGOCALCIFEROL (VITAMIN D2) 1,250 MCG (50,000 UNIT) PO CAP
1 | ORAL_CAPSULE | ORAL | 0 refills | 56.00000 days | Status: AC
Start: 2018-05-15 — End: 2018-06-05

## 2018-05-16 ENCOUNTER — Encounter: Admit: 2018-05-16 | Discharge: 2018-05-16 | Payer: BC Managed Care – PPO

## 2018-05-16 MED ORDER — CYCLOBENZAPRINE 10 MG PO TAB
ORAL_TABLET | Freq: Three times a day (TID) | 0 refills | PRN
Start: 2018-05-16 — End: ?

## 2018-05-23 ENCOUNTER — Encounter: Admit: 2018-05-23 | Discharge: 2018-05-23 | Payer: BC Managed Care – PPO

## 2018-05-23 DIAGNOSIS — M25571 Pain in right ankle and joints of right foot: Principal | ICD-10-CM

## 2018-05-27 ENCOUNTER — Encounter: Admit: 2018-05-27 | Discharge: 2018-05-27 | Payer: BC Managed Care – PPO

## 2018-05-27 ENCOUNTER — Ambulatory Visit: Admit: 2018-05-27 | Discharge: 2018-05-27 | Payer: BC Managed Care – PPO

## 2018-05-27 DIAGNOSIS — E119 Type 2 diabetes mellitus without complications: Principal | ICD-10-CM

## 2018-05-27 DIAGNOSIS — M25571 Pain in right ankle and joints of right foot: Principal | ICD-10-CM

## 2018-05-29 ENCOUNTER — Ambulatory Visit: Admit: 2018-05-29 | Discharge: 2018-05-30 | Payer: BC Managed Care – PPO

## 2018-05-29 ENCOUNTER — Encounter: Admit: 2018-05-29 | Discharge: 2018-05-29 | Payer: BC Managed Care – PPO

## 2018-05-29 DIAGNOSIS — S82891A Other fracture of right lower leg, initial encounter for closed fracture: Principal | ICD-10-CM

## 2018-06-05 ENCOUNTER — Encounter: Admit: 2018-06-05 | Discharge: 2018-06-05 | Payer: BC Managed Care – PPO

## 2018-06-05 MED ORDER — ERGOCALCIFEROL (VITAMIN D2) 1,250 MCG (50,000 UNIT) PO CAP
1 | ORAL_CAPSULE | ORAL | 0 refills | 56.00000 days | Status: AC
Start: 2018-06-05 — End: 2018-11-03

## 2018-06-10 ENCOUNTER — Ambulatory Visit: Admit: 2018-06-10 | Discharge: 2018-06-10 | Payer: BC Managed Care – PPO

## 2018-06-10 ENCOUNTER — Encounter: Admit: 2018-06-10 | Discharge: 2018-06-10 | Payer: BC Managed Care – PPO

## 2018-06-10 DIAGNOSIS — S32040D Wedge compression fracture of fourth lumbar vertebra, subsequent encounter for fracture with routine healing: ICD-10-CM

## 2018-06-10 DIAGNOSIS — S32020D Wedge compression fracture of second lumbar vertebra, subsequent encounter for fracture with routine healing: Principal | ICD-10-CM

## 2018-06-10 DIAGNOSIS — S32009D Unspecified fracture of unspecified lumbar vertebra, subsequent encounter for fracture with routine healing: ICD-10-CM

## 2018-06-10 DIAGNOSIS — E119 Type 2 diabetes mellitus without complications: Principal | ICD-10-CM

## 2018-06-16 NOTE — Progress Notes
Neurosurgery Clinic Follow-up    Patient Active Problem List    Diagnosis Date Noted   ??? Medication refill 04/29/2018   ??? Severe malnutrition (HCC) 04/03/2018   ??? Respiratory failure after trauma (HCC) 03/25/2018   ??? Closed fracture of transverse process of lumbar vertebra (HCC) 03/12/2018   ??? Multiple closed fractures of ribs of right side 03/12/2018   ??? Elevated CK 03/12/2018   ??? Acute blood loss anemia 03/12/2018   ??? Impaired mobility and ADLs 03/12/2018   ??? Traumatic pneumohemothorax without open wound into thorax 03/12/2018   ??? Closed fracture of sternum 03/12/2018   ??? Closed right ankle fracture 03/10/2018   ??? Subcutaneous emphysema (HCC) 03/10/2018   ??? Pain, acute due to trauma 03/10/2018   ??? HLD (hyperlipidemia) 03/10/2018   ??? Fall 03/10/2018         Mr. Daniel Dorsey returns today, with new plain films,  for f/u s/p L2 compression fx w/ retropulsion, L4 compression fx. L1-L5 TP fx. On 03/10/18.  He was followed by Dr. Sherryll Burger while in house.  He reports his back pain has resolved.  He reports occasional back pain, but overall is comfortable.  He has been compliant with wearing his brace. He denies any new lower extremity weakness or incontinence of bowel/bladder.  He denies any new symptoms or concerns.  He would like to get our of his brace.     Physical Exam     Alert/Fully Oriented  Neuro Intact and Stable  Motor/Strength 5/5  Gait - WC due to fx foot.    Mr. Daniel Dorsey returns today, with plain films, for 3 month follow up.  His plain films are stable and show:      1. Unchanged L2 compression fracture with anterior vertebral 70% height   loss. No new volume loss.  2. Stable L4 mild compression with 10-20% volume loss.  3. Slight retrolisthesis of L3 on L4.    Went over films with patient and his wife.  They voice understanding.  Explained how to wean from his brace.  Suggested PT to build core strengthening, they are agreeable and will hand carry referral to

## 2018-06-20 ENCOUNTER — Encounter: Admit: 2018-06-20 | Discharge: 2018-06-20 | Payer: BC Managed Care – PPO

## 2018-06-20 DIAGNOSIS — M25571 Pain in right ankle and joints of right foot: Principal | ICD-10-CM

## 2018-06-24 ENCOUNTER — Ambulatory Visit: Admit: 2018-06-24 | Discharge: 2018-06-24 | Payer: BC Managed Care – PPO

## 2018-06-24 ENCOUNTER — Encounter: Admit: 2018-06-24 | Discharge: 2018-06-24 | Payer: BC Managed Care – PPO

## 2018-06-24 DIAGNOSIS — M25571 Pain in right ankle and joints of right foot: Principal | ICD-10-CM

## 2018-06-24 DIAGNOSIS — S82891D Other fracture of right lower leg, subsequent encounter for closed fracture with routine healing: ICD-10-CM

## 2018-06-24 NOTE — Progress Notes
Daniel Dorsey returns to clinic for postoperative follow-up of right open treatment fracture of the right tibial plafond with a right ankle arthrodesis, posterior iliac crest bone graft, ex-fix removal on 03/28/2018.  Patient ports he is been doing well.  He has been bearing weight in his cast.  His incisions appear to be well healed.  Of note he does have slight maceration of the plantar aspect of his foot likely from where he had some moisture cumulating his cast.  His films today look encouragingly good.  We will get him out of the cast today and get him in a boot.  We will transition him into a shoe over the coming weeks.  He may take the boot off when driving and sleeping as well as for showering.  I like to see Daniel Dorsey back in 2 month with repeat ankle xrays    Daniel Daft, MD    ATTESTATION    I personally performed the key portions of the E/M visit, discussed case with resident and concur with resident documentation of history, physical exam, assessment, and treatment plan unless otherwise noted.      Staff name:  Ree Shay, MD Date:  06/24/2018

## 2018-07-08 ENCOUNTER — Encounter: Admit: 2018-07-08 | Discharge: 2018-07-08 | Payer: BC Managed Care – PPO

## 2018-07-08 NOTE — Telephone Encounter
This clinic received a plan of care progress note request to view and sign from [facility] physical therapy dept..  Progress note was reviewed and signed by NP  Records faxed to [phone]  Confirmation successful

## 2018-07-15 ENCOUNTER — Encounter: Admit: 2018-07-15 | Discharge: 2018-07-15 | Payer: BC Managed Care – PPO

## 2018-07-21 ENCOUNTER — Encounter: Admit: 2018-07-21 | Discharge: 2018-07-21 | Payer: BC Managed Care – PPO

## 2018-08-20 ENCOUNTER — Encounter: Admit: 2018-08-20 | Discharge: 2018-08-20 | Payer: BC Managed Care – PPO

## 2018-08-20 DIAGNOSIS — S82891D Other fracture of right lower leg, subsequent encounter for closed fracture with routine healing: Principal | ICD-10-CM

## 2018-08-21 ENCOUNTER — Encounter: Admit: 2018-08-21 | Discharge: 2018-08-21 | Payer: BC Managed Care – PPO

## 2018-08-21 NOTE — Telephone Encounter
Called patient about rescheduling appt due to clinic limitations, call was disconnected. Called patient back and left message on voice mail with my direct number to call about appt.

## 2018-08-22 ENCOUNTER — Encounter: Admit: 2018-08-22 | Discharge: 2018-08-23 | Payer: BC Managed Care – PPO

## 2018-08-25 ENCOUNTER — Encounter: Admit: 2018-08-25 | Discharge: 2018-08-25 | Payer: BC Managed Care – PPO

## 2018-08-25 NOTE — Telephone Encounter
Spoke with [facility] requested images be clouded for tomorrows telehealth visit. [phone].

## 2018-08-26 ENCOUNTER — Encounter: Admit: 2018-08-26 | Discharge: 2018-08-26 | Payer: BC Managed Care – PPO

## 2018-08-26 ENCOUNTER — Ambulatory Visit: Admit: 2018-08-26 | Discharge: 2018-08-26 | Payer: BC Managed Care – PPO

## 2018-08-26 DIAGNOSIS — E119 Type 2 diabetes mellitus without complications: Principal | ICD-10-CM

## 2018-08-26 DIAGNOSIS — M722 Plantar fascial fibromatosis: Principal | ICD-10-CM

## 2018-11-03 ENCOUNTER — Encounter: Admit: 2018-11-03 | Discharge: 2018-11-03

## 2018-11-03 MED ORDER — ERGOCALCIFEROL (VITAMIN D2) 1,250 MCG (50,000 UNIT) PO CAP
1 | ORAL_CAPSULE | ORAL | 0 refills | 56.00000 days | Status: AC
Start: 2018-11-03 — End: ?

## 2019-03-31 ENCOUNTER — Encounter: Admit: 2019-03-31 | Discharge: 2019-03-31 | Payer: BC Managed Care – PPO

## 2019-03-31 MED ORDER — ERGOCALCIFEROL (VITAMIN D2) 1,250 MCG (50,000 UNIT) PO CAP
ORAL_CAPSULE | 0 refills
Start: 2019-03-31 — End: ?

## 2019-09-25 ENCOUNTER — Encounter: Admit: 2019-09-25 | Discharge: 2019-09-25 | Payer: BC Managed Care – PPO

## 2019-09-25 NOTE — Progress Notes
CT Abd/Pelv dated 03/18/2018 sent to patient's PCP on file on 06/04/2019.  Phone calls placed to PCP office regarding F/U recommendations placed on 07/20/2019 & 09/23/2019.

## 2021-03-24 IMAGING — MR C-spine^Routine
5 series · 33 of 42 positions shown · non-contrast
Comparison: none

[Series 2: T2 · sagittal · 3.0mm · 0.54mm/px · 8 of 8 slices shown]
[im 1/8]
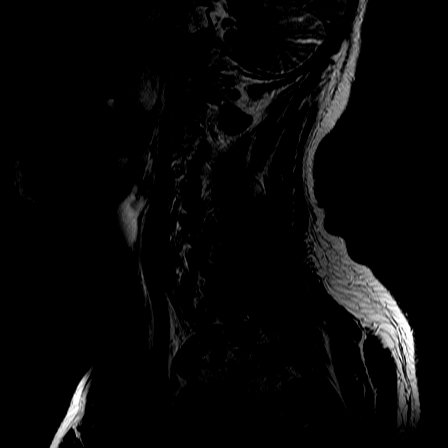
[im 2/8]
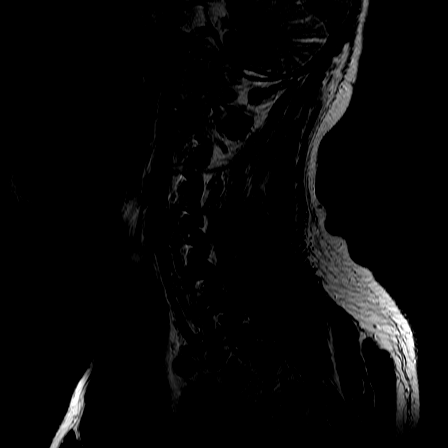
[im 3/8]
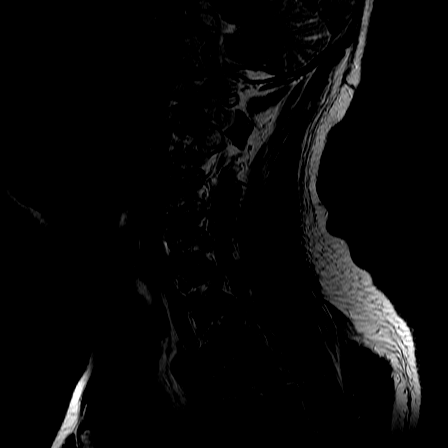
[im 4/8]
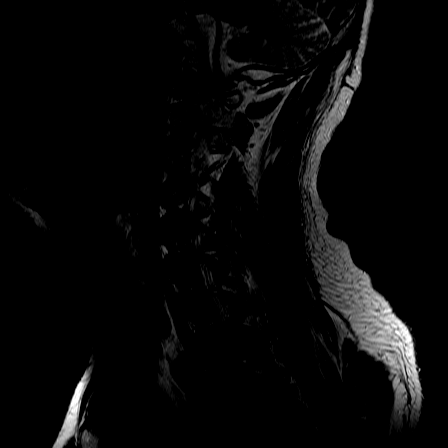
[im 5/8]
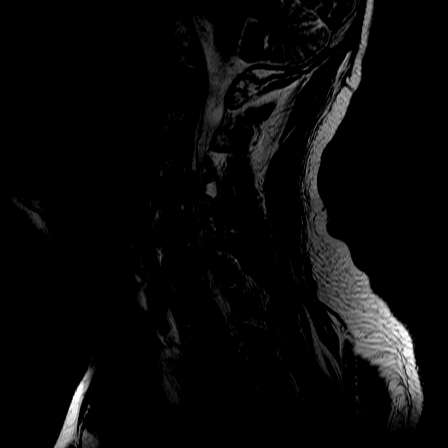
[im 6/8]
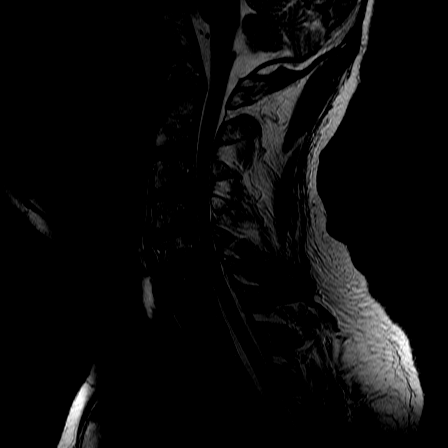
[im 7/8]
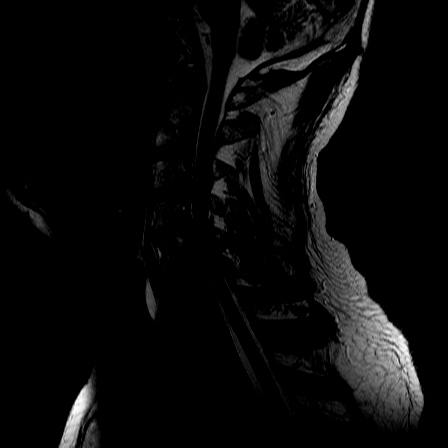
[im 8/8]
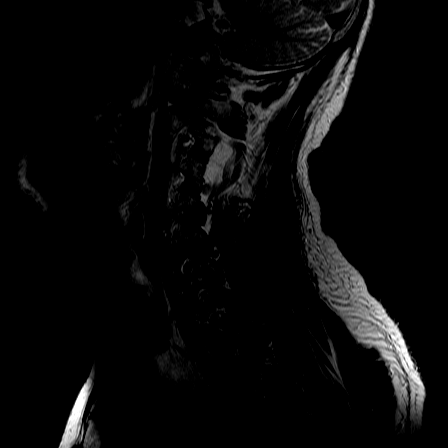

[Series 3: T1 · sagittal · 3.0mm · 0.75mm/px · 6 of 6 slices shown (1 of 2)]
[im 1/6]
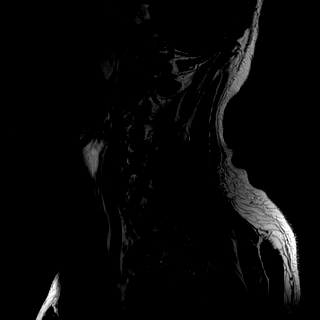
[im 2/6]
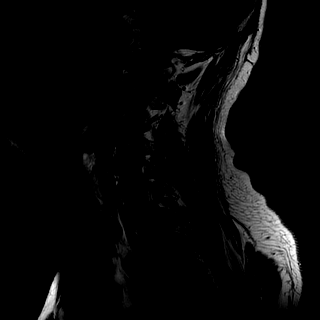
[im 3/6]
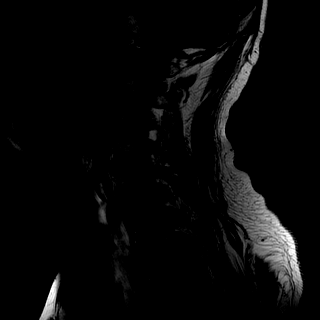
[im 4/6]
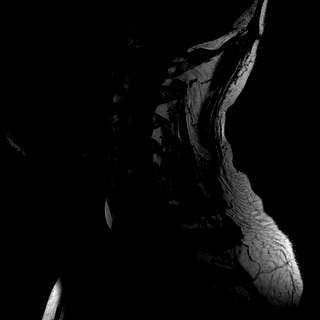
[im 5/6]
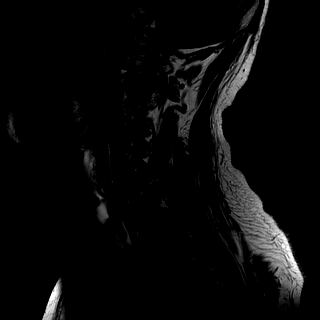
[im 6/6]
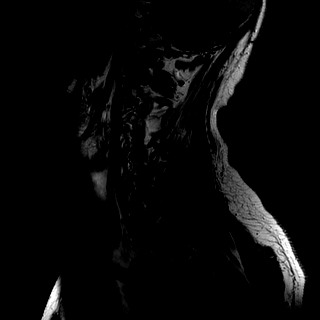

[Series 4: STIR · sagittal · 3.0mm · 0.94mm/px · 7 of 7 slices shown]
[im 1/7]
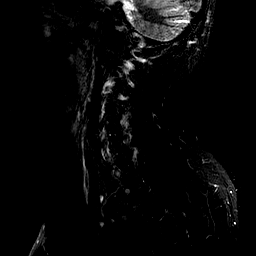
[im 2/7]
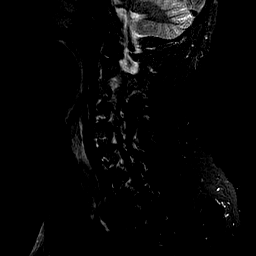
[im 3/7]
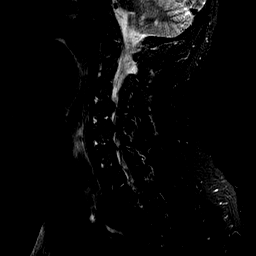
[im 4/7]
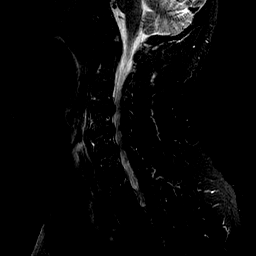
[im 5/7]
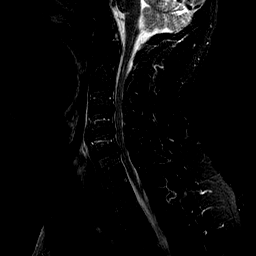
[im 6/7]
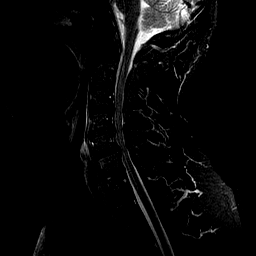
[im 7/7]
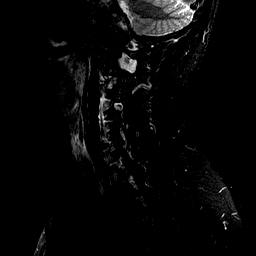

[Series 5: provider echo axial · axial · 3.0mm · 0.39mm/px · z∈[-51,+66]mm · 8 of 17 slices shown]
[im 2/17]
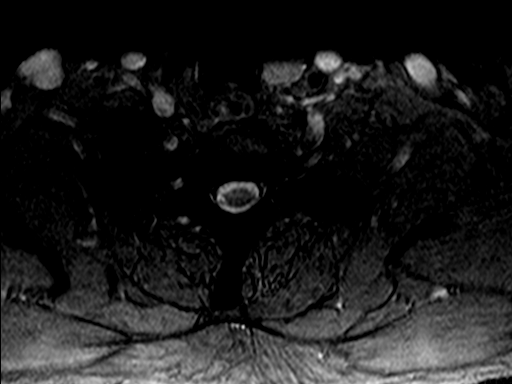
[im 4/17]
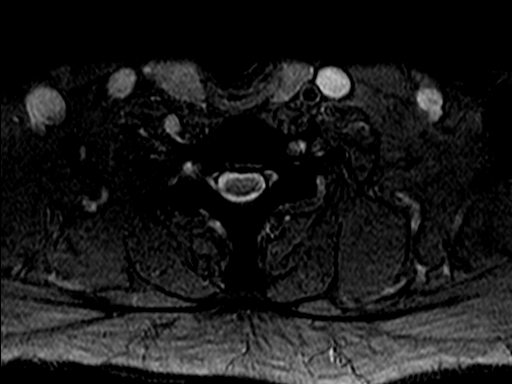
[im 6/17]
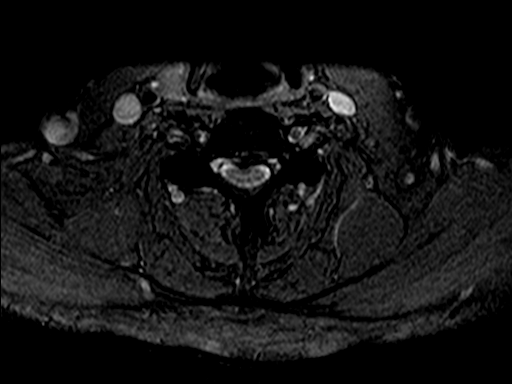
[im 8/17]
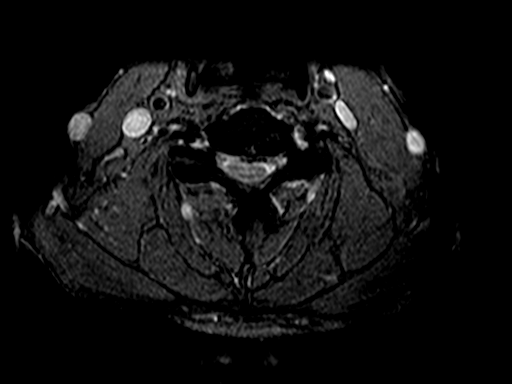
[im 10/17]
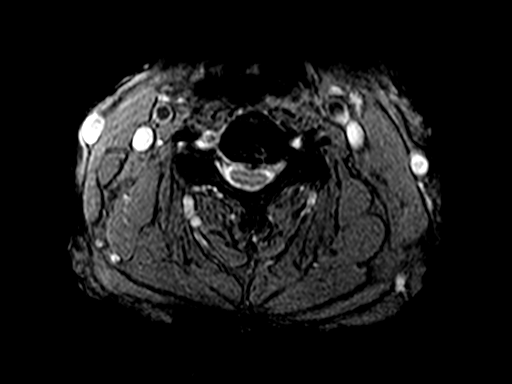
[im 12/17]
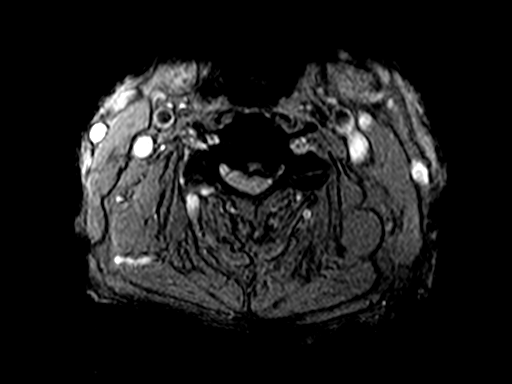
[im 14/17]
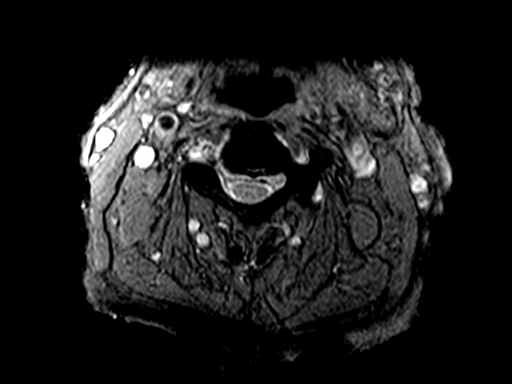
[im 16/17]
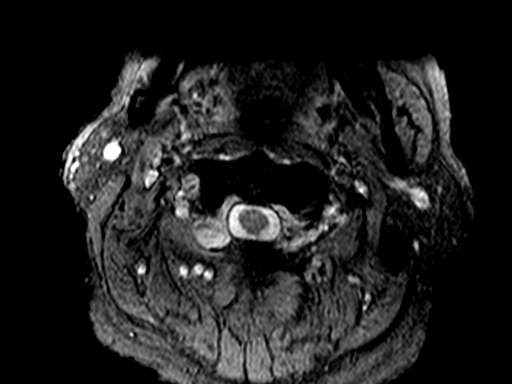

[Series 6: T1 · axial · 3.0mm · 0.78mm/px · z∈[-55,+19]mm · 4 of 4 slices shown (2 of 2)]
[im 1/4]
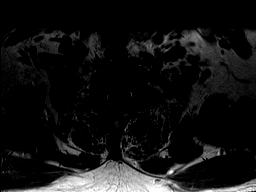
[im 2/4]
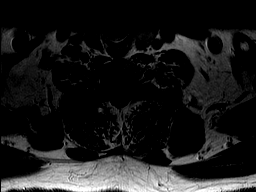
[im 3/4]
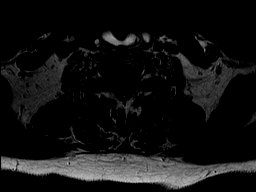
[im 4/4]
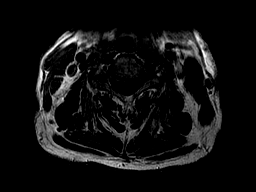

[33 of 42 positions shown; findings below may reference images not displayed]

EXAM

MAGNETIC RESONANCE IMAGING, SPINAL CANAL AND CONTENTS, CERVICAL; WITHOUT CONTRAST MATERIAL, CPT

INDICATION

LEFT ARM PAIN/WEAKNESS. LEFT SIDED RADICULOPATHY, PATIENT STATES HE WAS INJURED ON 10/14/19 AFTER
WORKING ABOVE HIS HEAD FOR A LONG PERIOD OF TIME.

TECHNIQUE

Multiplanar multiecho sequences were generated through the cervical spine utilizing a 1.5 Tesla
magnet.

COMPARISONS

Reference is made cervical spine CT dated 03/10/2018.

FINDINGS

There are 7 cervical vertebral bodies identified.  The vertebral body heights are adequately
preserved.  There is no evidence of malalignment. There is no evidence of a marrow infiltrative
process. There is no evidence of abnormal signal within the cord.

At the C2-3 level there is no evidence of central canal stenosis or neural foraminal stenosis.
There is no evidence facet arthrosis.

At the C3-4 level there is disc desiccation and broad-based posterior disc bulging with ventral and
left paracentral impingement on the thecal sac. There is moderate central canal stenosis asymmetric
to the left and flattening of the cord. The facet joints are preserved. There is moderate right-
sided and moderately severe left-sided neural foraminal narrowing. The facet joints are preserved.

At the C4-5 level there is disc desiccation and posterior disc bulging with a focal central disc
protrusion. Ventral impingement on the thecal sac, moderately severe central canal stenosis and
flattening of the cord is identified. There is moderate right-sided and severe left-sided neural
foraminal narrowing. The facet joints are preserved.

At the C5-6 level there is posterior disc bulging and focal central disc protrusion with ventral
impingement on the thecal sac and flattening of the cord. There is severe central canal stenosis.
Severe left-sided neural foraminal narrowing and moderate right-sided neural foraminal narrowing is
present. There is mild facet arthrosis.

At the C6-7 level there is broad-based disc bulging and a small central disc protrusion with
ventral impingement on the thecal sac. There is mild central canal stenosis. The facet joints are
preserved. The neural foramina are patent.

At the C7-T1 level there is no evidence of central canal stenosis or neural foraminal stenosis.
There is no evidence facet arthrosis.

The paravertebral musculature appears intact. There is no evidence of cervical adenopathy.
Visualized portions of the airway are patent.

IMPRESSION

1. Multilevel cervical spondylosis. There are prominent posterior disc protrusions at the C3-4, C4-
5 and C5-6 levels with ventral impingement the thecal sac and flattening of the cord.

2. There is multilevel bilateral neural foraminal narrowing as described in the above report with
moderately severe left-sided neural foraminal narrowing at C3-4 and severe left-sided neural
foraminal narrowing at C4-5 and C5-6.

3. Neuro surgical consultation is advised.

Tech Notes:

LEFT SIDED RADICULOPATHY, PT. STATES HE WAS INJURED ON 10/14/19 AFTER WORKING ABOVE HIS HEAD FOR A
LONG PERIOD OF TIME

## 2024-01-01 ENCOUNTER — Encounter: Admit: 2024-01-01 | Discharge: 2024-01-01 | Payer: PRIVATE HEALTH INSURANCE

## 2024-01-02 ENCOUNTER — Encounter: Admit: 2024-01-02 | Discharge: 2024-01-02 | Payer: PRIVATE HEALTH INSURANCE

## 2024-01-17 ENCOUNTER — Encounter: Admit: 2024-01-17 | Discharge: 2024-01-17 | Payer: PRIVATE HEALTH INSURANCE

## 2024-01-20 ENCOUNTER — Ambulatory Visit: Admit: 2024-01-20 | Discharge: 2024-01-21 | Payer: PRIVATE HEALTH INSURANCE

## 2024-01-20 ENCOUNTER — Encounter: Admit: 2024-01-20 | Discharge: 2024-01-20 | Payer: PRIVATE HEALTH INSURANCE

## 2024-01-20 VITALS — BP 130/73 | HR 58 | Ht 74.0 in | Wt 220.0 lb

## 2024-01-20 DIAGNOSIS — M545 Chronic bilateral low back pain, unspecified whether sciatica present: Principal | ICD-10-CM

## 2024-01-20 DIAGNOSIS — M5416 Radiculopathy, lumbar region: Secondary | ICD-10-CM

## 2024-01-20 DIAGNOSIS — M51362 Degeneration of intervertebral disc of lumbar region with discogenic back pain and lower extremity pain: Secondary | ICD-10-CM

## 2024-01-20 NOTE — Progress Notes
 Subjective:            Daniel Dorsey is a 63 year old male who presents with chronic low back pain and lower extremity radiculopathy. He is being referred by his primary care doctor.    He has experienced chronic low back pain since December 2019 following a fall from a tree stand, resulting in three compressed fractures in his lumbar spine and an ankle injury. The pain is described as a constant dull ache, rated between 5 and 6 out of 10, which becomes excruciating with prolonged sitting, standing, or walking. He has tried spinal injections in 2021 or 2022, which provided minimal relief for about one to two weeks, he believes this was a epidural steroid injection.     Hot showers offer some relief.    In addition to the back pain, he experiences radicular pain described as an 'electric shock' sensation, affecting his legs, left leg greater than right leg, and extending to his toes on both sides. He expresses pain traveling down the entire leg versus it being a one sided aspect.   This pain is intermittent and typically occurs after walking and then sitting down. No numbness or tingling is reported, but there is a recent onset of leg weakness, with a lack of stamina and difficulty walking long distances since his retirement in June. He attempts daily walks but often has to turn back due to his legs 'trying to give out.'    He has attempted various stretching exercises to manage his symptoms, though he does not adhere to a consistent routine. He has not engaged in recent formal physical therapy. 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    He is recently retired and is a non-smoker.        Allergies as of 01/20/2024    (No Known Allergies)       Patient Active Problem List    Diagnosis Date Noted    Medication refill 04/29/2018    Severe malnutrition 04/03/2018    Respiratory failure after trauma (CMS-HCC) 03/25/2018    Closed fracture of transverse process of lumbar vertebra (CMS-HCC) 03/12/2018    Multiple closed fractures of ribs of right side 03/12/2018    Elevated CK 03/12/2018    Acute blood loss anemia 03/12/2018    Impaired mobility and ADLs 03/12/2018    Traumatic pneumohemothorax without open wound into thorax 03/12/2018    Closed fracture of sternum 03/12/2018    Closed right ankle fracture 03/10/2018    Subcutaneous emphysema 03/10/2018    Pain, acute due to trauma 03/10/2018    HLD (hyperlipidemia) 03/10/2018    Fall 03/10/2018        Past Medical History:    Arthritis    Degenerative disc disease, cervical    Degenerative disc disease, lumbar    DM (diabetes mellitus) (CMS-HCC)    GERD (gastroesophageal reflux disease)    Glaucoma    Hernia of other specified site, with obstruction    High cholesterol    Spinal stenosis       Surgical History:   Procedure Laterality Date    APPLICATION EXTERNAL FIXATION SYSTEM MULTIPLANE - LOWER EXTREMITY Right 03/11/2018    Performed by Lindalee Norleen HERO, MD at [facility] [place]    OPEN TREATMENT WEIGHT BEARING ARTICULAR SURFACE/ PORTION DISTAL TIBIA FRACTURE WITH FIBULA INTERNAL FIXATION Right 03/28/2018    Performed by Bari Ny,  MD at [facility] [place]    ILIAC CREST BONE GRAFT Right 03/28/2018    Performed by Bari Ny, MD at [facility] [place]    REMOVAL EXTERNAL FIXATION SYSTEM UNDER ANESTHESIA - LOWER EXTREMITY Right 03/28/2018    Performed by Bari Ny, MD at [facility] [place]    FRACTURE SURGERY      HERNIA REPAIR      HX EYE SURGERY      SPINE SURGERY         Family History   Problem Relation Name Age of Onset    Cancer Father Norleen     Hypertension Father Norleen     Arthritis Father Norleen     Early Death Other Bobby     Cancer Other Beryl     Diabetes Other Jim     Stroke Mother Pat     Heart Disease Mother Bruna           Review of Systems   Musculoskeletal:  Positive for back pain.   Neurological:           BLE radiculopathy         Objective:          acetaminophen  (TYLENOL ) 500 mg tablet Take two tablets by mouth every 6 hours as needed for Pain. Max of 4,000 mg of acetaminophen  in 24 hours.    diclofenac sodium (VOLTAREN) 1 % topical gel Apply four g topically to affected area four times daily.    ergocalciferol  (VITAMIN D -2) 1,250 mcg (50,000 unit) capsule Take one capsule by mouth every 7 days.    latanoprost (XALATAN) 0.005 % ophthalmic solution Apply one drop to both eyes once.    omeprazole DR (PRILOSEC) 40 mg capsule Take one capsule by mouth daily before breakfast.    rosuvastatin (CRESTOR) 10 mg tablet Take one tablet by mouth daily.    timoloL maleate (TIMOPTIC) 0.5 % ophthalmic drops Apply one drop to both eyes once.    tiZANidine (ZANAFLEX) 4 mg capsule Take one capsule by mouth every 8 hours.     Vitals:    01/20/24 0836   BP: (P) 130/73   BP Source: (P) Arm, Left Upper   Pulse: (P) 58   SpO2: (P) 97%   PainSc: (P) Six   Weight: (P) 99.8 kg (220 lb)   Height: (P) 188 cm (6' 2)     Body mass index is 28.25 kg/m? (pended).         Physical Exam  Constitutional:       General: He is not in acute distress.     Appearance: He is not ill-appearing.   HENT:      Mouth/Throat:      Mouth: Mucous membranes are moist.      Pharynx: Oropharynx is clear.   Eyes:      Extraocular Movements: Extraocular movements intact.      Conjunctiva/sclera: Conjunctivae normal.      Pupils: Pupils are equal, round, and reactive to light.   Cardiovascular:      Pulses: Normal pulses.   Pulmonary:      Effort: Pulmonary effort is normal.   Skin:     General: Skin is warm and dry.   Neurological:      Mental Status: He is alert and oriented to person, place, and time.      Comments:   Patient is awake, alert, and cooperative with exam.  Speech is clear and fluent without aphasia or dysarthria.    Negative clonus  bilaterally    Psychiatric:         Mood and Affect: Mood normal.         Behavior: Behavior normal.         Thought Content: Thought content normal.         Judgment: Judgment normal. Motor:     R L     R L   Neck flexors       Hip flexors 5 5   Neck extensors               Shoulder Abductors 5 5   Hip extensors       Elbow flexors 5 5           Elbow extensors 5 5   Knee flexors 5 5   Wrist flexors 5 5   Knee extensors 5 5   Wrist extensors 5 5   Ankle dorsiflexors 4 5   Finger flexors 5 5   Ankle plantar flexors 4 5   Finger abductors 5 5           Fingers extensors 5 5                              RADIOLOGY  Lumbar spine MRI: Multilevel degenerative changes, greatest at L3-L4 level with moderate bilateral foraminal stenosis and moderate to severe spinal canal stenosis.   Mild left-sided neuroforaminal stenosis at the L2-L3 and L4-L5 levels as well.         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 Bilateral lower extremity radiculopathy with intermittent electric shock-like pain, more pronounced after walking and sitting, extending to toes. Recent weakness and decreased stamina in legs. Previous steroid injection provided minimal relief.  - Order physical therapy to mitigate risk of further injury and improve function.  - Order medial branch block injection to address constant back pain.  - Consider steroid injection for radicular pain     Lumbar spinal stenosis with neuroforaminal narrowing  Moderate bilateral neuroforaminal stenosis and moderate to severe spinal canal stenosis at L3-L4 level, it could be contributing to weakness and decreased stamina in legs.   - Monitor symptoms and response to physical therapy and injections.          These are some recommendations that are more conservative in nature to help control your back and/or neck pain.   - Drinking plenty of water   - Exercise/losing weight, obesity can put a lot of pressure on your spine  - Regular, gentle stretching   - Avoid pulling of lifting heavy objects     Additional links are provided below:    Helpful website to learn more: https://www.spine-health.com/ -this website has helpful videos that show you exercises that can be extremely helpful when done on a regular basis.   https://www.youtube.com/@[employer] -this also is a physical therapist that offers conservative exercises that can be helpful with back and neck pain when done regularly.    http://meza.com/  https://www.amazon.com/roller-massage-ball/s?k=roller+massage+ball  Heating pads for 10-15 minutes at a time   VerticalStretch.be ice packs (gel type) can be useful as well     All questions answered             Total Time Today  was 46 minutes in the following activities: Preparing to see the patient, Obtaining and/or reviewing separately obtained history, Performing a medically appropriate examination and/or evaluation, Counseling and educating the patient/family/caregiver, Ordering medications, tests, or procedures, Documenting clinical information in the electronic or other health record, and Independently interpreting results (not separately reported) and communicating results to the patient/family/caregiver       Hadassah ONEIDA Arabia, APRN-NP  Phone: [phone]

## 2024-01-28 ENCOUNTER — Encounter: Admit: 2024-01-28 | Discharge: 2024-01-28 | Payer: PRIVATE HEALTH INSURANCE

## 2024-02-01 NOTE — Progress Notes [1]
 [facility] HISTORY AND PHYSICAL    Dear Daniel Dorsey,    I appreciate your referral of Daniel Dorsey for evaluation of pain. Please see my note below for the full details of the evaluation and management plan.    Thank you for your time,  Suellen Fears, MD         HISTORY OF PRESENT ILLNESS:      Referring physician: Hadassah ONEIDA Dorsey    Chief complaint: Pain of the Lower Back and New Patient      Daniel Dorsey is a 63 y.o. male who  has a past medical history of Arthritis, Degenerative disc disease, cervical, Degenerative disc disease, lumbar, DM (diabetes mellitus) (CMS-HCC), GERD (gastroesophageal reflux disease), Glaucoma (2022), Hernia of other specified site, with obstruction, High cholesterol, Joint pain (2019), and Spinal stenosis.  Patient-entered data is noted with quotes.    This is a very pleasant 63 year old male who presents as referral from neurosurgery with a history of cervical fusion and 2022 and presents with low back pain along with lower extremity radiating pain has been going on for years  He has had a history of right ankle surgery and some bone was taken from the iliac on the right  He describes low back pain with trouble walking along with some lower extremity radiating pain on the left side greater than the right side he feels that the radiating pain is all over the leg and he has some difficulty walking distances  Nothing seems to make the pain better  He has done some physical therapy in the past  He denies a specific injury that created this and denies bowel or bladder dysfunction  No history of spine surgery noted  It was recommended that he try epidural or ablation therapy  He is not on any type of blood thinners  He has tried possibly some nerve medicine in the past but that affected his heart  He cannot remember which nerve medicine this was  He is on some tizanidine which helps to relax the muscles  We discussed minimally invasive options discussed his MRI result       PRIOR MEDICATIONS (C = current medication):   Effective  Tizanidine    Ineffective  NSAIDs  Acetaminophen     Unable to tolerate      Never/Unknown    Pregabalin  Ami/Nortriptyline  Duloxetine    Methocarbamol   Baclofen  Cyclobenzaprine       PRIOR INTERVENTIONS: hx ACDF 2022, Jakoi  Effective      Ineffective      Pain diagram:               Past Medical History:    Arthritis    Degenerative disc disease, cervical    Degenerative disc disease, lumbar    DM (diabetes mellitus) (CMS-HCC)    GERD (gastroesophageal reflux disease)    Glaucoma    Hernia of other specified site, with obstruction    High cholesterol    Joint pain    Spinal stenosis         Surgical History:   Procedure Laterality Date    APPLICATION EXTERNAL FIXATION SYSTEM MULTIPLANE - LOWER EXTREMITY Right 03/11/2018    Performed by Lindalee Norleen HERO, MD at [facility] OR    OPEN TREATMENT WEIGHT BEARING ARTICULAR SURFACE/ PORTION DISTAL TIBIA FRACTURE WITH FIBULA INTERNAL FIXATION Right 03/28/2018    Performed by Bari Ny, [facility] at [facility] OR    ILIAC CREST BONE GRAFT Right  03/28/2018    Performed by Bari Ny, MD at [facility] OR    REMOVAL EXTERNAL FIXATION SYSTEM UNDER ANESTHESIA - LOWER EXTREMITY Right 03/28/2018    Performed by Bari Ny, MD at [facility] OR    FRACTURE SURGERY      HERNIA REPAIR      HX EYE SURGERY      HX FUSION PROCEDURE  01/17/2024    Cervical spine    KNEE SURGERY      PR LAPAROSCOPY SURG RPR INITIAL INGUINAL HERNIA      SPINE SURGERY           Allergies[1]      Medications Ordered Prior to Encounter[2]      family history includes Arthritis in his father; Back pain in his father; Cancer in his father and other family members; Diabetes in an other family member; Early Death in an other family member; Heart Disease in his mother; Heart problem in his mother; Hypertension in his father; Joint Pain in his father; Neck Pain in his father; Stroke in his mother.      Social History     Socioeconomic History    Marital status: Married   Tobacco Use    Smoking status: Never    Smokeless tobacco: Never   Vaping Use    Vaping status: Never Used   Substance and Sexual Activity    Drug use: Never    Sexual activity: Not Currently     Partners: Female     Birth control/protection: None               Review of Systems   Musculoskeletal:  Positive for back pain.         PHYSICAL EXAM:    Vitals:    02/03/24 1243   BP: 120/75   BP Source: (P) Arm, Right Upper   Pulse: 70   Temp: 36.6 ?C (97.8 ?F)   SpO2: 98%   TempSrc: Oral   PainSc: Six   Weight: (P) 99.8 kg (220 lb)   Height: (P) 188 cm (6' 2)     Oswestry Total Score:: (Patient-Rptd) 38  Pain Score: Six  Body mass index is 28.25 kg/m? (pended).    General: The patient is a well-developed, well nourished 63 y.o. male in no acute distress.   HEENT: Head is normocephalic and atraumatic. Pupils are equal and reactive to light bilaterally.   Cardiac: Based on palpation, pulse appears to be regular rate and rhythm.   Pulmonary: The patient has unlabored respirations and bilateral symmetric chest excursion.   Abdomen: Soft, nontender, and nondistended. There is no rebound or guarding.   Extremities: No clubbing, cyanosis, or edema.     Neurologic:   The patient is alert and oriented times 3.   Cranial nerves II through XII are intact without any focal deficits.     Musculoskeletal:   Gait is normal.      Lumbar spine:  Appearance: No lesions or deformity, previous scar over right iliac noted  Lumbar tenderness: No  SI joint tenderness: Negative bilaterally  Pain with extension: Yes  Pain with lateral flexion: None  Sensation to light touch - left lower extremity: intact  Sensation to light touch - right lower extremity: intact  Straight leg raise: Positive bilaterally  FABER test: Negative bilaterally  FADIR test: Negative bilaterally  Lateral pelvic compression: Deferred  Thigh thrust: Deferred  Gaenslen test: Deferred  Pain with SCOUR Test: Deferred  Pain with Stinchfield Test: Deferred  Lower extremity strength:   Root Right Left   Hip Flexion L2 4 4   Knee Flexion L5/S1 4 4   Knee Extension L3 4 4   Dorsiflexion L4 3 4   Plantarflexion S1 3 5   EHL Extension L5 5 5     Lower extremity reflexes:   Right Left   Patellar 2/4 2/4   Achilles 2/4 2/4     History right ankle surgery noted    Neurological: Alert and oriented x3.        RADIOGRAPHIC EVALUATION:                   IMPRESSION:    1. Spinal stenosis of lumbar region with radiculopathy    2. Degeneration of intervertebral disc of lumbar region with discogenic back pain and lower extremity pain    3. Lumbar radiculopathy        Patient has had an adequate trial of > 12 month of rest, exercise, multimodal treatment, and the passage of time without improvement of symptoms. The pain has significant impact on the daily quality of life.     PLAN:   Interventions: Will plan for an L3-4 ILESI at his area of stenosis for relief in back and leg pain.  There could be an option for ablation therapy in the future  This patient's clinical history, exam, AND imaging support radiculopathy AND there is a significant impact on quality of life and function AND the pain has been present for at least 4 weeks AND they have failed to improve with noninvasive conservative care.  Medications: Continue current pharmacologic regimen.  Option to consider gabapentin  but we are unclear if he had tried this and has side effect to it in the past.  We will hold for now.  Imaging: MRI reviewed  Continue PT/Exercise therapy as tolerated.   Follow up after procedure.     Risks/benefits of all pharmacologic and interventional treatments discussed and questions answered.     Suellen Fears, MD  Anesthesiology and Interventional Pain      CC: Daniel Dorsey            [1] No Known Allergies  [2]   Current Outpatient Medications on File Prior to Visit   Medication Sig Dispense Refill    acetaminophen  (TYLENOL ) 500 mg tablet Take two tablets by mouth every 6 hours as needed for Pain. Max of 4,000 mg of acetaminophen  in 24 hours.      diclofenac sodium (VOLTAREN) 1 % topical gel Apply four g topically to affected area four times daily.      ergocalciferol  (VITAMIN D -2) 1,250 mcg (50,000 unit) capsule Take one capsule by mouth every 7 days. 12 capsule 0    latanoprost (XALATAN) 0.005 % ophthalmic solution Apply one drop to both eyes once.      omeprazole DR (PRILOSEC) 40 mg capsule Take one capsule by mouth daily before breakfast.      rosuvastatin (CRESTOR) 10 mg tablet Take one tablet by mouth daily.      timoloL maleate (TIMOPTIC) 0.5 % ophthalmic drops Apply one drop to both eyes once.      tiZANidine (ZANAFLEX) 4 mg capsule Take one capsule by mouth every 8 hours.       No current facility-administered medications on file prior to visit.

## 2024-02-03 ENCOUNTER — Encounter: Admit: 2024-02-03 | Discharge: 2024-02-03 | Payer: PRIVATE HEALTH INSURANCE

## 2024-02-03 ENCOUNTER — Ambulatory Visit: Admit: 2024-02-03 | Discharge: 2024-02-04 | Payer: PRIVATE HEALTH INSURANCE

## 2024-02-03 DIAGNOSIS — M5416 Radiculopathy, lumbar region: Secondary | ICD-10-CM

## 2024-02-04 DIAGNOSIS — M48061 Spinal stenosis, lumbar region without neurogenic claudication: Secondary | ICD-10-CM

## 2024-02-04 DIAGNOSIS — M51362 Other intervertebral disc degeneration, lumbar region with discogenic back pain and lower extremity pain: Secondary | ICD-10-CM

## 2024-02-07 ENCOUNTER — Encounter: Admit: 2024-02-07 | Discharge: 2024-02-07 | Payer: PRIVATE HEALTH INSURANCE

## 2024-02-09 NOTE — Progress Notes [1]
 [facility]  INTERVENTIONAL PAIN PROCEDURE HISTORY AND PHYSICAL    Chief Complaint: Pain    HISTORY OF PRESENT ILLNESS:    Patient returns today for interventional treatment of radicular pain. Patient denies any recent fevers, chills, infection, antibiotics, coagulopathy or contra-indicated anticoagulants. Risks of the procedure were discussed including but not limited to bleeding, infection, damage to surrounding structures and reaction to medications. Patient reports understanding and has elected to proceed with the procedure.        This patient's clinical history, exam, AND imaging support radiculopathy AND there is a significant impact on quality of life and function AND their pain score has been documented in this note AND the pain has been present for at least 4 weeks AND they have failed to improve with noninvasive conservative care.         Past Medical History:    Arthritis    Degenerative disc disease, cervical    Degenerative disc disease, lumbar    DM (diabetes mellitus) (CMS-HCC)    GERD (gastroesophageal reflux disease)    Glaucoma    Hernia of other specified site, with obstruction    High cholesterol    Joint pain    Spinal stenosis       Surgical History:   Procedure Laterality Date    APPLICATION EXTERNAL FIXATION SYSTEM MULTIPLANE - LOWER EXTREMITY Right 03/11/2018    Performed by Lindalee Norleen HERO, MD at [facility] OR    OPEN TREATMENT WEIGHT BEARING ARTICULAR SURFACE/ PORTION DISTAL TIBIA FRACTURE WITH FIBULA INTERNAL FIXATION Right 03/28/2018    Performed by Bari Ny, MD at [facility] OR    ILIAC CREST BONE GRAFT Right 03/28/2018    Performed by Bari Ny, MD at [facility] OR    REMOVAL EXTERNAL FIXATION SYSTEM UNDER ANESTHESIA - LOWER EXTREMITY Right 03/28/2018    Performed by Bari Ny, MD at [facility] OR    FRACTURE SURGERY      HERNIA REPAIR      HX EYE SURGERY      HX FUSION PROCEDURE  01/17/2024    Cervical spine    KNEE SURGERY      PR LAPAROSCOPY SURG RPR INITIAL INGUINAL HERNIA      SPINE SURGERY family history includes Arthritis in his father; Back pain in his father; Cancer in his father and other family members; Diabetes in an other family member; Early Death in an other family member; Heart Disease in his mother; Heart problem in his mother; Hypertension in his father; Joint Pain in his father; Neck Pain in his father; Stroke in his mother.    Social History     Socioeconomic History    Marital status: Married   Tobacco Use    Smoking status: Never    Smokeless tobacco: Never   Vaping Use    Vaping status: Never Used   Substance and Sexual Activity    Drug use: Never    Sexual activity: Not Currently     Partners: Female     Birth control/protection: None       Allergies[1]        REVIEW OF SYSTEMS: 10 point ROS obtained and negative except as above.      PHYSICAL EXAM:  General: Alert, cooperative, no acute distress.  HEENT: Normocephalic, atraumatic.  Neck: Supple.  Lungs: Unlabored respirations, bilateral and equal chest excursion.  Heart: Regular rate.  Skin: Warm and dry to touch.  Abdomen: Nondistended.  MSK: No deformity  Neurological: Alert and oriented x3.  IMPRESSION:    1. Spinal stenosis of lumbar region with radiculopathy         PLAN: L3-4 ILESI       Suellen Fears, MD  Anesthesiology & Interventional Pain         [1] No Known Allergies

## 2024-02-10 ENCOUNTER — Encounter: Admit: 2024-02-10 | Discharge: 2024-02-10 | Payer: PRIVATE HEALTH INSURANCE

## 2024-02-14 ENCOUNTER — Ambulatory Visit: Admit: 2024-02-14 | Discharge: 2024-02-14 | Payer: PRIVATE HEALTH INSURANCE

## 2024-02-14 ENCOUNTER — Encounter: Admit: 2024-02-14 | Discharge: 2024-02-14 | Payer: PRIVATE HEALTH INSURANCE

## 2024-02-14 ENCOUNTER — Ambulatory Visit: Admit: 2024-02-14 | Discharge: 2024-02-15 | Payer: PRIVATE HEALTH INSURANCE

## 2024-02-14 VITALS — BP 160/84 | HR 61 | Resp 12 | Ht 74.0 in | Wt 220.0 lb

## 2024-02-14 DIAGNOSIS — M5416 Radiculopathy, lumbar region: Secondary | ICD-10-CM

## 2024-02-14 DIAGNOSIS — M48061 Spinal stenosis, lumbar region without neurogenic claudication: Principal | ICD-10-CM

## 2024-02-14 DIAGNOSIS — M51362 Degeneration of intervertebral disc of lumbar region with discogenic back pain and lower extremity pain: Secondary | ICD-10-CM

## 2024-02-14 MED ORDER — LIDOCAINE (PF) 10 MG/ML (1 %) IJ SOLN
2 mL | Freq: Once | INTRAMUSCULAR | 0 refills | Status: CP
Start: 2024-02-14 — End: ?
  Administered 2024-02-14: 16:00:00 2 mL via INTRAMUSCULAR

## 2024-02-14 MED ORDER — IOHEXOL 240 MG IODINE/ML IV SOLN
2.5 mL | Freq: Once | EPIDURAL | 0 refills | Status: CP
Start: 2024-02-14 — End: ?
  Administered 2024-02-14: 16:00:00 2.5 mL via EPIDURAL

## 2024-02-14 MED ORDER — SODIUM CHLORIDE 0.9 % IJ SOLN
3 mL | Freq: Once | INTRAMUSCULAR | 0 refills | Status: CP
Start: 2024-02-14 — End: ?
  Administered 2024-02-14: 16:00:00 3 mL via INTRAMUSCULAR

## 2024-02-14 MED ORDER — TRIAMCINOLONE ACETONIDE 40 MG/ML IJ SUSP
80 mg | Freq: Once | EPIDURAL | 0 refills | Status: CP
Start: 2024-02-14 — End: ?
  Administered 2024-02-14: 16:00:00 80 mg via EPIDURAL

## 2024-02-14 NOTE — Procedures [3]
 Attending Surgeon: Suellen Fears, MD    Anesthesia: Local      Daniel Dorsey  Procedure: epidural - interlaminar    Laterality: n/a   on 02/14/2024 10:00 AM  Location: lumbar ESI with imaging - L3-4      Consent:   Consent obtained: written  Consent given by: patient  Risks discussed: bleeding, infection, no change or worsening in pain, reaction to medication, nerve damage, bruising, allergic reaction, seizure, swelling and weakness  Alternatives discussed: alternative treatment, no treatment, delayed treatment and referral  Discussed with patient the purpose of the treatment/procedure, other ways of treating my condition, including no treatment/ procedure and the risks and benefits of the alternatives. Patient has decided to proceed with treatment/procedure.        Universal Protocol:  Relevant documents: relevant documents present and verified  Test results: test results available and properly labeled  Imaging studies: imaging studies available  Required items: required blood products, implants, devices, and special equipment available  Site marked: the operative site was marked  Patient identity confirmed: Patient identify confirmed verbally with patient.        Time out: Immediately prior to procedure a time out was called to verify the correct patient, procedure, equipment, support staff and site/side marked as required      Procedures Details:   Indications: pain   Prep: chlorhexidine  Patient position: prone  Estimated Blood Loss: minimal  Specimens: none  Number of Levels: 1  Approach: midline  Guidance: fluoroscopy  Contrast: Procedure confirmed with contrast under live fluoroscopy.  Needle and Epidural Catheter: tuohy  Needle size: 18 G  Injection procedure: Incremental injection, Negative aspiration for blood and Introduced needle  Amount Injected:   L3-4: 5mL  Patient tolerance: Patient tolerated the procedure well with no immediate complications. Pressure was applied, and hemostasis was accomplished.  Outcome: Pain improved  Comments: INTERVENTIONAL PAIN MANAGEMENT PROCEDURE REPORT    Lumbar Epidural    Date of Service: 02/14/24     Procedure Title(s):    1. L3-4 Interlaminar epidural injection   2. Intraoperative fluoroscopy     Attending Surgeon: Fears  Anesthesia: Local anesthetic    Indications: Daniel Dorsey is a 63 y.o. male with a diagnosis of pain. The patient's history and physical exam were reviewed. The risks, benefits and alternatives to the procedure were discussed, and all questions were answered to the patient's satisfaction. The patient agreed to proceed, and written informed consent was obtained.     Procedure in Detail: IV was started? No    The patient was brought into the procedure room and placed in the prone position on the fluoroscopy table. Standard monitors were placed, and vital signs were observed throughout the procedure. The area of the lumbar spine was prepped with 2% chlorhexidine and draped in a sterile manner.     The L3-4 interspace was identified and marked under AP fluoroscopy. The skin and subcutaneous tissues in the area were anesthetized with 1% lidocaine . A Tuohy epidural needle was directed toward the interspace under fluoroscopic guidance until the ligamentum flavum was engaged. From this point, using lateral and oblique fluoroscopic guidance, a loss of resistance technique with a glass syringe and saline/air was used to identify entrance of the needle into the epidural space.  Once an appropriate loss of resistance was obtained, negative aspiration was confirmed and contrast solution was injected. An appropriate epidurogram was noted on AP and lateral fluoroscopy with no vascular or intrathecal uptake.  Then, after negative aspiration, a solution containing 80mg  Triamcinolone and preservative-free normal saline was easily injected. The needle was removed. The patient's back was cleaned and a bandage was placed over the site of needle insertion.    Disposition: The patient tolerated the procedure well, and there were no apparent complications. Vital signs remained stable througtout the procedure. The patient was taken to the recovery area where discharge instructions for the procedure were given.     Estimated Blood Loss: Minimal    Specimens: None    Complications: None      This patient's clinical history, exam, AND imaging support radiculopathy AND there is a significant impact on quality of life and function AND the pain has been present for at least 4 weeks AND they have failed to improve with noninvasive conservative care.            Estimated blood loss: none or minimal  Specimens: none  Patient tolerated the procedure well with no immediate complications. Pressure was applied, and hemostasis was accomplished.  Administrations This Visit       iohexoL  (OMNIPAQUE -240) 240 mg/mL injection 2.5 mL       Admin Date  02/14/2024 Action  Given Dose  2.5 mL Route  Epidural Documented By  Alexandria Roots, RN              lidocaine  PF 1% (10 mg/mL) injection 2 mL       Admin Date  02/14/2024 Action  Given Dose  2 mL Route  Injection Documented By  Alexandria Roots, RN              sodium chloride  PF 0.9% injection 3 mL       Admin Date  02/14/2024 Action  Given Dose  3 mL Route  Injection Documented By  Alexandria Roots, RN              triamcinolone acetonide [facility]) injection 80 mg       Admin Date  02/14/2024 Action  Given Dose  80 mg Route  Epidural Documented By  Alexandria Roots, RN

## 2024-02-14 NOTE — Progress Notes [1]

## 2024-02-14 NOTE — Patient Instructions [37]
 Procedure Completed Today: Lumbar Epidural Steroid Injection    Important information following your procedure today: You may drive today    Pain relief may not be immediate. It is possible you may even experience an increase in pain during the first 24-48 hours followed by a gradual decrease of your pain.  Though the procedure is generally safe and complications are rare, we do ask that you be aware of any of the following:   Any swelling, persistent redness, new bleeding, or drainage from the site of the injection.  You should not experience a severe headache.  You should not run a fever over 101? F.  New onset of sharp, severe back & or neck pain.  New onset of upper or lower extremity numbness or weakness.  New difficulty controlling bowel or bladder function after the injection.  New shortness of breath.    If any of these occur, please call to report this occurrence to Dr. Cheral Marker at [phone]. If you are calling after 4:00 p.m., on weekends or holidays please call [phone] and ask to have the resident physician on call for the physician paged or go to your local emergency room.  You may experience soreness at the injection site. Ice can be applied at 20 minute intervals. Avoid application of direct heat, hot showers or hot tubs today.  Avoid strenuous activity today. You may resume your regular activities and exercise tomorrow.  Patients with diabetes may see an elevation in blood sugars for 7-10 days after the injection. It is important to pay close attention to your diet, check your blood sugars daily and report extreme elevations to the physician that treats your diabetes.  Patients taking a daily blood thinner can resume their regular dose this evening.  It is important that you take all medications ordered by your pain physician. Taking medication as ordered is an important part of your pain care plan. If you cannot continue the medication plan, please notify the physician.     Possible side effects to steroids that may occur:  Flushing or redness of the face  Irritability  Fluid retention  Change in women?s menses    The following medications were used: Lidocaine , Triamcinolone  , and Contrast Dye

## 2024-02-25 ENCOUNTER — Encounter: Admit: 2024-02-25 | Discharge: 2024-02-25 | Payer: PRIVATE HEALTH INSURANCE

## 2024-03-16 ENCOUNTER — Encounter: Admit: 2024-03-16 | Discharge: 2024-03-16 | Payer: PRIVATE HEALTH INSURANCE

## 2024-03-21 NOTE — Progress Notes [1]
 ===================================================    TELEHEALTH (TELEMEDICINE) ENCOUNTER    Synchronous Video Meeting Platform: [employer]    Obtained patient's verbal consent to treat them and their agreement to [employer] financial policy and NPP via this telehealth visit during the [facility] [employer] [facility]    Provider State: [place]  Patient State: [address] [place]  [id number]    ==================================================      [facility] - Interventional Pain  FOLLOW UP  Subjective     Chief Complaint:   Chief Complaint   Patient presents with    Lower Back - Pain    Left Leg - Pain    Right Leg - Pain    Follow Up       F/u after L3-4 ESI 02/2024  Fortunately, he only received about 1 weeks relief after his epidural injection  His pain in the back and legs have started to resurface and 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  He was previously seen by neurosurgery and they are looking to avoid surgery if possible  He continues to have a sign of claudication but also has signs of axial back pain  He was initially referred here for possible medial branch blocks but he had not had any treatment of his stenosis  We discussed shifting course and trialing a test injection for axial back components that he has  No new injuries noted  Pain is worse at night lays down           PRIOR MEDICATIONS:   Effective      Ineffective  NSAID  Acetaminophen     Unable to tolerate      Never  Gabapentin    Lyrica  Ami/Nortriptyline  Cymbalta  Tizanidine      PRIOR INTERVENTIONS:   Effective      Ineffective    L3-4 ESI, 1 week relief        Daniel Dorsey denies any recent fevers, chills, infection, antibiotics, bowel or bladder incontinence, saddle anesthesia, bleeding issues, or recent anticoagulant.     ROS: All 14 systems reviewed and found to be negative except as above and as follows.    Past Medical History:  Past Medical History:    Arthritis    Degenerative disc disease, cervical Degenerative disc disease, lumbar    DM (diabetes mellitus) (CMS-HCC)    GERD (gastroesophageal reflux disease)    Glaucoma    Hernia of other specified site, with obstruction    High cholesterol    Joint pain    Spinal stenosis       Family History:  Family History   Problem Relation Name Age of Onset    Cancer Father Daniel Dorsey     Hypertension Father Daniel Dorsey     Arthritis Father Daniel Dorsey     Early Death Other Daniel Dorsey     Cancer Other Daniel Dorsey     Diabetes Other Daniel Dorsey     Stroke Mother Daniel Dorsey     Heart Disease Mother Daniel Dorsey     Back pain Father Daniel Dorsey     Joint Pain Father Daniel Dorsey     Neck Pain Father Daniel Dorsey     Cancer Other Daniel Dorsey     Heart problem Mother Daniel Dorsey        Social History:  Lives in [place] [id number]    Social History     Socioeconomic History    Marital status: Married   Tobacco Use    Smoking status: Never    Smokeless tobacco: Never  Vaping Use    Vaping status: Never Used   Substance and Sexual Activity    Drug use: Never    Sexual activity: Not Currently     Partners: Female     Birth control/protection: None       Allergies:  Allergies[1]    Medications:  Current Medications[2]        Physical examination:     VS:   There were no vitals taken for this visit.       Patient has h/o HTN: No  Patient reports being afebrile Yes    Exam performed by instructing patient to perform various maneuvers and provide feedback while I personally visualized the patient exam.     General: The patient is a well-developed, well nourished 63 y.o. male in no acute distress.   HEENT: Head is normocephalic and atraumatic. Pupils are equal bilaterally.   Cardiac: Patient reports regular pulse on palpation.   Pulmonary: The patient has unlabored respirations and bilateral symmetric chest excursion.   Abdomen: Soft, nontender, and nondistended. There is no rebound or guarding reported with patient palpation.   Extremities: No peripheral edema reported by patient or seen on video.     Neurologic:   The patient is alert and oriented times 3.   Cranial nerves II through XII are intact without any focal deficits.     Musculoskeletal:   Gait is normal.    L-Spine   There is no paraspinal tenderness. Paraspinal muscle tone is normal.  Facet loading is negative.  There is no tenderness or radiating pain with palpation over the SI joints or greater trochanteric bursae bilaterally.  ROM with flexion, extension, rotation, and lateral bending is intact.  Some pain with extension noted  Patient believes strength is equal and adequate bilaterally in the flexors and extensors of the bilateral lower extremities.         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 The pain has significant impact on the daily quality of life.     PLAN:   Interventions: At this point we have treated his stenosis with an epidural and he received very short-term relief with this.  Will attempt to shift course and we will try a bilateral L4-S1 MBB with consideration for RFA for axial low back pain.  As a reminder he previously saw neurosurgery.  Medial Branch Block Checklist:    Moderate to severe chronic neck pain, predominately axial, that causes functional deficit measured on pain or disability scale.  Yes *[ ]   No [x ]   Moderate to severe chronic mid back pain, predominately axial, that causes functional deficit measured on pain or disability scale.  Yes *[ ]   No [x ]   Moderate to severe chronic low back pain, predominately axial, that causes functional deficit measured on pain or disability scale.  Yes *[x ]  No [ ]   Pain present for minimum of 3 months with documented failure to respond to noninvasive conservative management such  as OTCs/PT     Patient has radiculopathy yes  Previous radiculopathy treated by ESI injection Yes *[ x]  No [ ]     NA [ ]      Patient has neurogenic claudication   Yes *[ x]  No [ ]   Has neurogenic claudication been treated   Yes *[x ]  No [ ]     NA [ ]              There is no non-facet pathology per clinical assessment or radiology studies that could explain the source of the patient 's pain, including but not limited to           Fracture [ ]   Tumor [ ]  Infection [ ]   Significant deformity [ ]   Purpose of Injection:  Diagnostic [x]   Therapeutic [ ]     Previous Medial Branch Block injection was equal to or greater than 2 weeks      N/a  Has this level been previously ablated greater than 2 years Yes *[ ]   No [ ]     NA [x ]     This level has never had an Anterior Lumbar Interbody Fusion      Yes *[x ]  No [ ]          Oswestry:  Oswestry Total Score:: (Patient-Rptd) 56      Medications: Continue current pharmacologic regimen.  Can consider gabapentin  but will hold for now.  Imaging: MRI reviewed  Continue PT/Exercise therapy as tolerated.   Follow up after procedure.     Risks/benefits of all pharmacologic and interventional treatments discussed and questions answered.     Suellen Fears, MD  Anesthesiology and Interventional Pain      CC: Dr. No ref. provider found         [1] No Known Allergies  [2]   Current Outpatient Medications:     acetaminophen  (TYLENOL ) 500 mg tablet, Take two tablets by mouth every 6 hours as needed for Pain. Max of 4,000 mg of acetaminophen  in 24 hours., Disp: , Rfl:     diclofenac sodium (VOLTAREN) 1 % topical gel, Apply four g topically to affected area four times daily., Disp: , Rfl:     ergocalciferol  (VITAMIN D -2) 1,250 mcg (50,000 unit) capsule, Take one capsule by mouth every 7 days., Disp: 12 capsule, Rfl: 0    latanoprost (XALATAN) 0.005 % ophthalmic solution, Apply one drop to both eyes once., Disp: , Rfl:     omeprazole DR (PRILOSEC) 40 mg capsule, Take one capsule by mouth daily before breakfast., Disp: , Rfl:     rosuvastatin (CRESTOR) 10 mg tablet, Take one tablet by mouth daily., Disp: , Rfl:     timoloL maleate (TIMOPTIC) 0.5 % ophthalmic drops, Apply one drop to both eyes once., Disp: , Rfl:     tiZANidine (ZANAFLEX) 4 mg capsule, Take one capsule by mouth every 8 hours., Disp: , Rfl:

## 2024-03-23 ENCOUNTER — Ambulatory Visit: Admit: 2024-03-23 | Discharge: 2024-03-24 | Payer: PRIVATE HEALTH INSURANCE

## 2024-03-23 ENCOUNTER — Encounter: Admit: 2024-03-23 | Discharge: 2024-03-23 | Payer: PRIVATE HEALTH INSURANCE

## 2024-03-23 DIAGNOSIS — M47816 Spondylosis without myelopathy or radiculopathy, lumbar region: Principal | ICD-10-CM

## 2024-03-30 ENCOUNTER — Encounter: Admit: 2024-03-30 | Discharge: 2024-03-30 | Payer: PRIVATE HEALTH INSURANCE

## 2024-04-10 ENCOUNTER — Encounter: Admit: 2024-04-10 | Discharge: 2024-04-10 | Payer: PRIVATE HEALTH INSURANCE

## 2024-04-17 ENCOUNTER — Encounter: Admit: 2024-04-17 | Discharge: 2024-04-17 | Payer: PRIVATE HEALTH INSURANCE

## 2024-04-18 ENCOUNTER — Encounter: Admit: 2024-04-18 | Discharge: 2024-04-18 | Payer: PRIVATE HEALTH INSURANCE

## 2024-04-24 ENCOUNTER — Ambulatory Visit: Admit: 2024-04-24 | Discharge: 2024-04-25 | Payer: PRIVATE HEALTH INSURANCE

## 2024-04-24 ENCOUNTER — Encounter: Admit: 2024-04-24 | Discharge: 2024-04-24 | Payer: PRIVATE HEALTH INSURANCE

## 2024-04-24 ENCOUNTER — Ambulatory Visit: Admit: 2024-04-24 | Discharge: 2024-04-24 | Payer: PRIVATE HEALTH INSURANCE

## 2024-04-26 ENCOUNTER — Encounter: Admit: 2024-04-26 | Discharge: 2024-04-26 | Payer: PRIVATE HEALTH INSURANCE

## 2024-04-29 ENCOUNTER — Encounter: Admit: 2024-04-29 | Discharge: 2024-04-29 | Payer: PRIVATE HEALTH INSURANCE

## 2024-04-29 DIAGNOSIS — M47816 Spondylosis without myelopathy or radiculopathy, lumbar region: Principal | ICD-10-CM

## 2024-04-29 NOTE — Telephone Encounter [36]
 [place] [facility] SPINE INJECT MBB LUMB/SACRAL  Procedure: medial branch block     Laterality: bilateral     Location: - L3, L4 and L5    Pt messaged to report results of MBB #1. Pt reports receiving over 80% pain relief immediately following the procedure that lasted over 4 hours. MBB #2 ordered. Pt instructed to call [phone] to schedule and notified schedulers.

## 2024-05-01 ENCOUNTER — Encounter: Admit: 2024-05-01 | Discharge: 2024-05-01 | Payer: PRIVATE HEALTH INSURANCE

## 2024-05-03 ENCOUNTER — Encounter: Admit: 2024-05-03 | Discharge: 2024-05-03 | Payer: PRIVATE HEALTH INSURANCE

## 2024-05-03 NOTE — Progress Notes [1]
 SPINE CENTER  INTERVENTIONAL PAIN PROCEDURE HISTORY AND PHYSICAL    Chief Complaint: Pain    HISTORY OF PRESENT ILLNESS:    Patient returns today for interventional treatment of axial back pain. Patient denies any recent fevers, chills, infection, antibiotics, coagulopathy or contra-indicated anticoagulants. Risks of the procedure were discussed including but not limited to bleeding, infection, damage to surrounding structures and reaction to medications. Patient reports understanding and has elected to proceed with the procedure.      This patient had at least 80% pain relief for at least 2 hours with the last MBB. The pain score was 10/10 prior to the injection and 2/10 following the injection.      Oswestry:  Oswestry Total Score:: (Patient-Rptd) 54             Past Medical History:    Arthritis    Degenerative disc disease, cervical    Degenerative disc disease, lumbar    DM (diabetes mellitus) (CMS-HCC)    GERD (gastroesophageal reflux disease)    Glaucoma    Hernia of other specified site, with obstruction    High cholesterol    Joint pain    Spinal stenosis       Surgical History:   Procedure Laterality Date    APPLICATION EXTERNAL FIXATION SYSTEM MULTIPLANE - LOWER EXTREMITY Right 03/11/2018    Performed by Lindalee Norleen HERO, MD at [facility] OR    OPEN TREATMENT WEIGHT BEARING ARTICULAR SURFACE/ PORTION DISTAL TIBIA FRACTURE WITH FIBULA INTERNAL FIXATION Right 03/28/2018    Performed by Bari Ny, MD at [facility] OR    ILIAC CREST BONE GRAFT Right 03/28/2018    Performed by Bari Ny, MD at [facility] OR    REMOVAL EXTERNAL FIXATION SYSTEM UNDER ANESTHESIA - LOWER EXTREMITY Right 03/28/2018    Performed by Bari Ny, MD at [facility] OR    FRACTURE SURGERY      HERNIA REPAIR      HX EYE SURGERY      HX FUSION PROCEDURE  01/17/2024    Cervical spine    KNEE SURGERY      PR LAPAROSCOPY SURG RPR INITIAL INGUINAL HERNIA      SPINE SURGERY      SURGERY         family history includes Arthritis in his father; Back pain in his father; Cancer in his father and other family members; Diabetes in an other family member; Early Death in an other family member; Heart Disease in his mother; Heart problem in his mother; Hypertension in his father; Joint Pain in his father; Neck Pain in his father; Stroke in his mother.    Social History     Socioeconomic History    Marital status: Married   Tobacco Use    Smoking status: Never    Smokeless tobacco: Never   Vaping Use    Vaping status: Never Used   Substance and Sexual Activity    Drug use: Never    Sexual activity: Not Currently     Partners: Female     Birth control/protection: None       Allergies[1]        REVIEW OF SYSTEMS: 10 point ROS obtained and negative except as above.      PHYSICAL EXAM:  General: Alert, cooperative, no acute distress.  HEENT: Normocephalic, atraumatic.  Neck: Supple.  Lungs: Unlabored respirations, bilateral and equal chest excursion.  Heart: Regular rate.  Skin: Warm and dry to touch.  Abdomen: Nondistended.  MSK: No deformity  Neurological: Alert and oriented x3.        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

## 2024-05-08 ENCOUNTER — Ambulatory Visit: Admit: 2024-05-08 | Discharge: 2024-05-08 | Payer: PRIVATE HEALTH INSURANCE

## 2024-05-08 ENCOUNTER — Encounter: Admit: 2024-05-08 | Discharge: 2024-05-08 | Payer: PRIVATE HEALTH INSURANCE

## 2024-05-08 ENCOUNTER — Ambulatory Visit: Admit: 2024-05-08 | Discharge: 2024-05-09 | Payer: PRIVATE HEALTH INSURANCE

## 2024-05-08 DIAGNOSIS — M47816 Spondylosis without myelopathy or radiculopathy, lumbar region: Principal | ICD-10-CM

## 2024-05-08 MED ORDER — BUPIVACAINE (PF) 0.5 % (5 MG/ML) IJ SOLN
3 mL | Freq: Once | INTRAMUSCULAR | 0 refills | Status: CP
Start: 2024-05-08 — End: ?
  Administered 2024-05-08: 15:00:00 3 mL via INTRAMUSCULAR

## 2024-05-08 NOTE — Progress Notes [1]
"   Pain Procedure Plan Of Care    Risk of injury related to procedure  Patient identification, allergies verified, fall precautions implemented    Risk of injury and impaired skin integrity  Positioning devices applied as appropriate for procedure, patient transported with staff assistance    Management of Pain  Pain assessment completed on arrival, PAR scoring following procedure and at discharge, sedation administered as ordered, patient positioned for comfort    Risk of anxiety related to procedure and disease process  Patient education on procedure and expectations, provide coping support to patient, provide relaxation techniques    Outcomes:  The patient is free of injury during and following their procedure.  Skin is intact and free of bruising.  The patients pain is managed during their stay.  Alleviation of patient anxiety exhibited.  "

## 2024-05-08 NOTE — Procedures [3]
 Attending Surgeon: Suellen Fears, MD    Anesthesia: Local      [place] AM[facility] SPINE INJECT MBB LUMB/SACRAL  Procedure: medial branch block    Laterality: bilateral    Location: - L3, L4 and L5      Consent:   Consent obtained: written  Consent given by: patient  Risks discussed: allergic reaction, bleeding, bruising, infection, nerve damage, no change or worsening in pain, reaction to medication, seizure, swelling and weakness  Alternatives discussed: alternative treatment, delayed treatment, no treatment and referral  Discussed with patient the purpose of the treatment/procedure, other ways of treating my condition, including no treatment/ procedure and the risks and benefits of the alternatives. Patient has decided to proceed with treatment/procedure.        Universal Protocol:  Relevant documents: relevant documents present and verified  Test results: test results available and properly labeled  Imaging studies: imaging studies available  Required items: required blood products, implants, devices, and special equipment available  Site marked: the operative site was marked  Patient identity confirmed: Patient identify confirmed verbally with patient.        Time out: Immediately prior to procedure a time out was called to verify the correct patient, procedure, equipment, support staff and site/side marked as required      Patient has had previous Medial Branch Block with greater than 80% relief that lasted more than 1 hour which is consistent with the local agent used.    Procedures Details:   Indications: pain, diagnostic evaluation and Axial Pain   Prep: chlorhexidine  Patient position: prone  Estimated Blood Loss: minimal  Specimens: none  Number of Levels: 2  Guidance: fluoroscopy  Needle and Epidural Catheter: quincke  Needle size: 25 G  Injection procedure: Incremental injection and Negative aspiration for blood  Amount Injected:   L3: 1mL  L4: 1mL  L5: 1mL  Patient tolerance: Patient tolerated the procedure well with no immediate complications. Pressure was applied, and hemostasis was accomplished.  Outcome: Pain improved  Comments: INTERVENTIONAL PAIN PROCEDURE REPORT    Lumbar Facet Medial Branch Blocks    Date of Service: 05/08/2024     Procedure Title(s):    1. Bilateral L3-L5 medial branch nerve blocks to target the bilateral L4-5, L5-S1 facet joints   2. Intraoperative fluoroscopy     Attending Surgeon: Suellen Fears, MD    Anesthesia: Local anesthesia    Indications: Daniel Dorsey is a 64 y.o. male with a diagnosis of low back pain and lumbar spondylosis without myelopathy. The patient's history and physical exam were reviewed. The patient has failed conservative measures including physical therapy and medication management. On exam the patients exhibits significant tenderness in the above stated levels which is exacerbated by extension and lateral flexion to the painful sides. The risks, benefits and alternatives to the procedure were discussed, and all questions were answered to the patient's satisfaction. The patient agreed to proceed, and written informed consent was obtained.    Procedure in Detail:     The patient was brought into the procedure room and placed in the prone position on the fluoroscopy table. Standard monitors were placed, and vital signs were observed throughout the procedure. The area of the lumbar spine was prepped with 2% chlorhexidine and draped in a sterile manner.     AP fluoroscopy was used to identify the junction of the superior articular process and the transverse process at the L4 and L5 levels on the right side. The sacral ala was identified  and marked. A 25-gauge spinal needle was advanced toward each of these points under fluoroscopic guidance. Once bone was contacted, negative aspiration was confirmed and 1 mL of 0.5% bupivacaine  was injected at each level.    The same procedure was performed on the opposite side?  Yes    After the procedure was completed, the patient's back was cleaned and bandages were placed at the needle insertion sites.    Disposition: The patient tolerated the procedure well, and there were no apparent complications. Vital signs remained stable througtout the procedure. The patient was taken to the recovery area where discharge instructions for the procedure were given. The patient was given a pain diary to assess response to this diagnostic block. If the patient does experience significant pain relief with the diagnostic block, consideration will be given to radiofrequency ablation of the above treated levels at a subsequent visit.    Estimated Blood Loss: Minimal    Specimens: None    Complications: None          Estimated blood loss: none or minimal  Specimens: none  Patient tolerated the procedure well with no immediate complications. Pressure was applied, and hemostasis was accomplished.  Administrations This Visit       bupivacaine  PF (MARCAINE ) 0.5 % injection 3 mL       Admin Date  05/08/2024 Action  Given Dose  3 mL Route  Injection Documented By  Zengotita, Melanie, RN

## 2024-05-08 NOTE — Patient Instructions [37]
 Discharge Instructions for Medial Branch Block    Important information following your procedure today: You may drive today    This injection is for diagnostic purposes, it is a test. Only short-term results are expected.    Though the procedure is generally safe and complications are rare, we do ask that you be aware of any of the following:   Any swelling, persistent redness, new bleeding, or drainage from the site of the injection.  You should not experience a severe headache.  You should not run a fever over 101? F.  New onset of sharp, severe back & or neck pain.  New onset of upper or lower extremity numbness or weakness.  New difficulty controlling bowel or bladder function after the injection.  New shortness of breath.    If any of these occur, please call to report this occurrence to Dr. Vinita at [phone]. If you are calling after 4:00 p.m. or on weekends or holidays please call [phone] and ask to have the resident physician on call for the physician paged or go to your local emergency room.    Avoid application of direct heat, hot showers or hot tubs today.    Remain active today. Do the activities that would normally cause you pain. After the injection, you should get at least 80% relief for up to 2-4 hours.    Call back to report the results to a nurse tomorrow at Dr. Vinita at [phone]. You may have to leave a message. Give your name and contact information and answer the following questions.  Did you get relief?  Percentage of relief?  How long did it last?    If you are a candidate, a scheduler will call you within the next week to schedule your next procedure. The nurse will call you back if your results were not as expected to discuss next steps in your treatment plan.      The following medications were used: Bupivicaine        If you are unable to keep your upcoming appointment, please notify the [facility] scheduler at [phone] at least 24 hours in advance. Radiofrequency Ablation and Medial Branch Blocks        Medial Branch Nerve Block   A medial branch block is a type of spinal injection to temporarily block the pain signals coming from the medial nerves. Each facet joint is connected to 2 medial branch nerves that carry pain signals away from the spine to the brain and we inject those nerves with a local anesthetic to verify we are targeting the right area. These will only address pain localized to the back, it will not address any radiating pain.   The medial branch injection procedure includes the following steps:  Commonly, the procedure is performed without any sedation.  The patient lies face down on an procedure table, and the skin over the area to be tested is well cleansed  The physician treats a small area of skin with a numbing medicine (anesthetic), which may sting for a few seconds  The physician uses X-ray guidance (fluoroscopy) to direct a very small needle over the medial branch nerves  A small amount of contrast dye is then injected to confirm that the medicine covers the medial branch nerve  Following this confirmation, a small amount of numbing medicine (anesthetic) will then be slowly injected onto each targeted nerve.  The injection itself only takes a few minutes, but the entire procedure usually takes between fifteen and  thirty minutes.    Online Education that includes video and more information: [email or web address]    You should expect one of the following outcomes:  The pain does not go away. This is a sign the back pain you are experiencing is not a result of facet joint problems.  The pain goes away for a few hours but then comes back. This means the back pain is likely from facet joint problems and your doctor may recommend additional treatment in that area.    You will follow-up the next day by call your nurse with the results from this injection. It is common to only get a few hours of relief from these injections, as it is only a local anesthetic being used and will wear off quickly. You will call in the results letting us  know how much of your pain was relieved, in terms of a percentage, and for how long that relief lasted.     If the first test block is successful we will do one more before we move on to the Radiofrequency Ablation.     Radiofrequency Ablation (RFA)  The radiofrequency ablation procedure includes the following steps:  RFA will be performed under mild sedation. If sedation is used, the patient is usually kept awake and conscious to an extent to be able to describe what they feel during the stimulation and lesioning of the nerve. You can have nothing to eat for 6 hours prior, and you can only have clear liquids up until 2 hours prior to your appointment. You will need a driver to accompany you to the appointment.  The patient lies on his/her stomach on a procedure table. If sedation is used, an intravenous (IV) line is started so that relaxation medicine (sedation) can be given.  The skin over the treatment area (neck, mid-back, or low back) is well cleaned to minimize the risk of infection.  The physician numbs a small area of skin by injecting a numbing medicine (anesthetic) in the region of the RFA injection site.  The physician uses x-ray guidance (fluoroscopy) to direct the RFA needle toward the medial or lateral branch nerves.  Once the needle tip is placed accurately, an active electrode is inserted through the needle and a small amount of electrical current is carefully passed next to the target nerve and a safe distance from other nerves. This current may briefly recreate the painful symptoms that the patient usually experiences.  Once the target nerve is confirmed, a heat lesion is created on the nerve using the preferred method (conventional, pulsed, or water-cooled radiofrequency) of ablation.  This process may be repeated for additional nerves.  The entire procedure usually takes up to 15 minutes.       Online Education that includes video and more information: [email or web address]-rfa-procedure-[email or web address]-recovery    It is not uncommon to have a pain flare-up after the procedure for 2-3 weeks. The nerve endings have been burned, and are now inflamed. Once they have calmed down, patients should start to see improvement. If you are still having pain 4 weeks out, please schedule a follow-up visit. Use an anti-inflammatory medication and ice to help with the pain. Please avoid heat therapy right after the procedure.

## 2024-05-09 ENCOUNTER — Encounter: Admit: 2024-05-09 | Discharge: 2024-05-09 | Payer: PRIVATE HEALTH INSURANCE

## 2024-05-11 ENCOUNTER — Encounter: Admit: 2024-05-11 | Discharge: 2024-05-11 | Payer: PRIVATE HEALTH INSURANCE

## 2024-05-11 DIAGNOSIS — M47816 Spondylosis without myelopathy or radiculopathy, lumbar region: Principal | ICD-10-CM
